# Patient Record
Sex: Female | Born: 1990
Health system: Southern US, Community
[De-identification: ages and names within clinical notes are randomized; demographics above are authoritative.]

## PROBLEM LIST (undated history)

## (undated) DIAGNOSIS — G43909 Migraine, unspecified, not intractable, without status migrainosus: Secondary | ICD-10-CM

## (undated) DIAGNOSIS — F329 Major depressive disorder, single episode, unspecified: Secondary | ICD-10-CM

## (undated) DIAGNOSIS — E559 Vitamin D deficiency, unspecified: Secondary | ICD-10-CM

## (undated) DIAGNOSIS — F32A Depression, unspecified: Secondary | ICD-10-CM

## (undated) DIAGNOSIS — F419 Anxiety disorder, unspecified: Secondary | ICD-10-CM

## (undated) DIAGNOSIS — D352 Benign neoplasm of pituitary gland: Secondary | ICD-10-CM

## (undated) DIAGNOSIS — D649 Anemia, unspecified: Secondary | ICD-10-CM

## (undated) DIAGNOSIS — E049 Nontoxic goiter, unspecified: Secondary | ICD-10-CM

## (undated) DIAGNOSIS — Z8489 Family history of other specified conditions: Secondary | ICD-10-CM

## (undated) HISTORY — DX: Migraine, unspecified, not intractable, without status migrainosus: G43.909

## (undated) HISTORY — DX: Depression, unspecified: F32.A

## (undated) HISTORY — DX: Nontoxic goiter, unspecified: E04.9

## (undated) HISTORY — DX: Major depressive disorder, single episode, unspecified: F32.9

## (undated) HISTORY — DX: Benign neoplasm of pituitary gland: D35.2

## (undated) HISTORY — DX: Anemia, unspecified: D64.9

## (undated) HISTORY — DX: Anxiety disorder, unspecified: F41.9

## (undated) HISTORY — DX: Vitamin D deficiency, unspecified: E55.9

---
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
## 2015-02-27 ENCOUNTER — Ambulatory Visit (INDEPENDENT_AMBULATORY_CARE_PROVIDER_SITE_OTHER): Payer: BLUE CROSS/BLUE SHIELD | Admitting: Family Medicine

## 2015-02-27 ENCOUNTER — Encounter: Payer: Self-pay | Admitting: Family Medicine

## 2015-02-27 VITALS — BP 126/82 | HR 100 | Temp 97.4°F | Ht 66.5 in | Wt 179.0 lb

## 2015-02-27 DIAGNOSIS — J301 Allergic rhinitis due to pollen: Secondary | ICD-10-CM | POA: Diagnosis not present

## 2015-02-27 DIAGNOSIS — F329 Major depressive disorder, single episode, unspecified: Secondary | ICD-10-CM | POA: Diagnosis not present

## 2015-02-27 DIAGNOSIS — F4010 Social phobia, unspecified: Secondary | ICD-10-CM

## 2015-02-27 DIAGNOSIS — E049 Nontoxic goiter, unspecified: Secondary | ICD-10-CM

## 2015-02-27 DIAGNOSIS — R5383 Other fatigue: Secondary | ICD-10-CM | POA: Insufficient documentation

## 2015-02-27 DIAGNOSIS — J309 Allergic rhinitis, unspecified: Secondary | ICD-10-CM | POA: Insufficient documentation

## 2015-02-27 DIAGNOSIS — F32A Depression, unspecified: Secondary | ICD-10-CM | POA: Insufficient documentation

## 2015-02-27 DIAGNOSIS — Z0000 Encounter for general adult medical examination without abnormal findings: Secondary | ICD-10-CM

## 2015-02-27 NOTE — Assessment & Plan Note (Signed)
List of therapists given with phone numbers from [facility]; patient will call and schedule an appointment; I'll be glad to see her back in 6-8 weeks to see how she's progressing; she declined offer for medicine today

## 2015-02-27 NOTE — Assessment & Plan Note (Signed)
Check thyroid US today along with TSH

## 2015-02-27 NOTE — Assessment & Plan Note (Signed)
Check TSH, vit D, Hgb today to r/o other cause, though she reports years of depression; no mania or hypomania per report; she declined offer for medicine; no SI/HI; crisis line card given for her to keep; if thoughts of self-harm develop, seek medical attention right away; she agrees; repeat PHQ-9 at follow-up in 6-8 weeks after she starts therapy

## 2015-02-27 NOTE — Assessment & Plan Note (Signed)
Avoid decongestants given her family hx of HTN; okay to use plain anti-histamines; I would be happy to add on nasal corticosteroid if desired

## 2015-02-27 NOTE — Patient Instructions (Addendum)
Try to use PLAIN allergy medicine without the decongestant Avoid: phenylephrine, phenylpropanolamine, and pseudoephredine I'm ordering a thyroid ultrasound to see if it is larger than it should be We'll also get lab tests to evaluate your thyroid and vitamin D levels, as well as routine preventive care labs If you have not heard anything from my staff in a week about any orders/referrals/studies from today, please contact us here to follow-up [phone] Please do schedule an appointment to start working with a therapist Return in 6-8 weeks to see how therapy is coming along If you ever have thoughts of hurting yourself, seek medical care immediately Keep the crisis line card with you in case of any need in the future Women should have physicals yearly and pap smears starting at age 2, with follow-up pap smears every three years If we can schedule you for a physical, we'll be glad to take care of that for you here; if you would prefer to see a gynecologist, you are welcome to go there for your physicals... your choice

## 2015-02-27 NOTE — Progress Notes (Signed)
BP 126/82 mmHg  Pulse 100  Temp(Src) 97.4 F (36.3 C)  Ht 5' 6.5" (1.689 m)  Wt 179 lb (81.194 kg)  BMI 28.46 kg/m2  SpO2 100%  LMP 02/13/2015 (Approximate)   Subjective:    Patient ID: Sabrina Mcgee, female    DOB: 1991-06-30, 24 y.o.   MRN: [id number]  HPI: Sabrina Mcgee is a 24 y.o. female  Chief Complaint  Patient presents with  . Establish Care   Allergies Social anxiety, depression; no thoughts of self-harm in a few years; interested in counseling; she does not want medicine; social anxiety started in elementary school; she denies any hx of mania or hypomania; anxiety runs in the family; she is an introvert  Allergies; uses plain claritin if needed  Fam hx of thyroid issue; patient does feel lump in neck she thinks  Mother:  She has always had high blood pressure; no heart attack; not quite sure; not sure about cholesterol, but she doesn't have diabetes Father: just getting over kidney cancer Cancer on dad's side of the family  No recent labs; had some juice for breakfast  History reviewed. No pertinent past medical history.  Family History  Problem Relation Age of Onset  . Heart disease Mother   . Hypertension Mother   . Cancer Father     kidney  . Heart disease Father   . Hypertension Father   . Cancer Paternal Aunt     breast  . Heart disease Maternal Grandmother   . Hypertension Maternal Grandmother   . Heart disease Maternal Grandfather   . Hypertension Maternal Grandfather   . Heart disease Paternal Grandmother   . Hypertension Paternal Grandmother   . COPD Paternal Grandmother   . Cancer Paternal Grandfather     prostate  . Heart disease Paternal Grandfather   . Hypertension Paternal Grandfather   . Diabetes Paternal Aunt    Relevant past medical, surgical, family and social history reviewed and updated as indicated. Interim medical history since our last visit reviewed. Allergies and medications reviewed and updated.  Review of Systems   Constitutional: Negative for unexpected weight change.  Gastrointestinal: Positive for constipation (fluctuates).  Endocrine: Positive for cold intolerance. Negative for polydipsia and polyuria.  Allergic/Immunologic: Positive for environmental allergies (seasonal allergies).  Psychiatric/Behavioral: The patient is nervous/anxious (social anxiety and depression since childhood; she will be open to seeing someone).    Per HPI unless specifically indicated above     Objective:    BP 126/82 mmHg  Pulse 100  Temp(Src) 97.4 F (36.3 C)  Ht 5' 6.5" (1.689 m)  Wt 179 lb (81.194 kg)  BMI 28.46 kg/m2  SpO2 100%  LMP 02/13/2015 (Approximate)  Wt Readings from Last 3 Encounters:  02/27/15 179 lb (81.194 kg)    Physical Exam  Constitutional: She appears well-developed and well-nourished. No distress.  HENT:  Head: Normocephalic and atraumatic.  Eyes: EOM are normal. No scleral icterus.  Neck: Thyromegaly (right lobe larger than left) present.  Thyroid nontender  Cardiovascular: Normal rate, regular rhythm and normal heart sounds.   No murmur heard. Pulmonary/Chest: Effort normal and breath sounds normal. No respiratory distress. She has no wheezes.  Abdominal: Soft. Bowel sounds are normal. She exhibits no distension.  Musculoskeletal: Normal range of motion. She exhibits no edema.  Neurological: She is alert. She exhibits normal muscle tone.  Reflex Scores:      Patellar reflexes are 2+ on the right side and 2+ on the left side. Skin: Skin  is warm and dry. No rash noted. She is not diaphoretic. No pallor.  Psychiatric: Her speech is normal and behavior is normal. Judgment and thought content normal. Her mood appears anxious. Cognition and memory are normal.  Quiet, soft-spoken; good eye contact with examiner    No results found for this or any previous visit.    Assessment & Plan:   Problem List Items Addressed This Visit      Respiratory   Allergic rhinitis    Avoid  decongestants given her family hx of HTN; okay to use plain anti-histamines; I would be happy to add on nasal corticosteroid if desired        Endocrine   Goiter    Check thyroid US today along with TSH      Relevant Orders   TSH   US Soft Tissue Head/Neck     Other   Fatigue    Check TSH, vit D, Hgb      Relevant Orders   TSH   Vit D  25 hydroxy (rtn osteoporosis monitoring)   Social anxiety disorder - Primary    List of therapists given with phone numbers from [facility]; patient will call and schedule an appointment; I'll be glad to see her back in 6-8 weeks to see how she's progressing; she declined offer for medicine today      Depression    Check TSH, vit D, Hgb today to r/o other cause, though she reports years of depression; no mania or hypomania per report; she declined offer for medicine; no SI/HI; crisis line card given for her to keep; if thoughts of self-harm develop, seek medical attention right away; she agrees; repeat PHQ-9 at follow-up in 6-8 weeks after she starts therapy       Other Visit Diagnoses    Preventative health care        Relevant Orders    CBC with Differential/Platelet    Lipid Panel w/o Chol/HDL Ratio    Comprehensive metabolic panel       Follow up plan: Return in about 7 weeks (around 04/17/2015) for for follow-up.  An after-visit summary was printed and given to the patient at [place].  Please see the patient instructions which may contain other information and recommendations beyond what is mentioned above in the assessment and plan.

## 2015-02-27 NOTE — Assessment & Plan Note (Signed)
Check TSH, vit D, Hgb

## 2015-02-28 ENCOUNTER — Telehealth: Payer: Self-pay | Admitting: Family Medicine

## 2015-02-28 DIAGNOSIS — D509 Iron deficiency anemia, unspecified: Secondary | ICD-10-CM

## 2015-02-28 DIAGNOSIS — E559 Vitamin D deficiency, unspecified: Secondary | ICD-10-CM

## 2015-02-28 LAB — CBC WITH DIFFERENTIAL/PLATELET
Basophils Absolute: 0 10*3/uL (ref 0.0–0.2)
Basos: 0 %
EOS (ABSOLUTE): 0 10*3/uL (ref 0.0–0.4)
Eos: 1 %
Hematocrit: 31 % — ABNORMAL LOW (ref 34.0–46.6)
Hemoglobin: 10.3 g/dL — ABNORMAL LOW (ref 11.1–15.9)
Immature Grans (Abs): 0 10*3/uL (ref 0.0–0.1)
Immature Granulocytes: 0 %
Lymphocytes Absolute: 1.8 10*3/uL (ref 0.7–3.1)
Lymphs: 36 %
MCH: 25.3 pg — ABNORMAL LOW (ref 26.6–33.0)
MCHC: 33.2 g/dL (ref 31.5–35.7)
MCV: 76 fL — ABNORMAL LOW (ref 79–97)
Monocytes Absolute: 0.5 10*3/uL (ref 0.1–0.9)
Monocytes: 11 %
Neutrophils Absolute: 2.6 10*3/uL (ref 1.4–7.0)
Neutrophils: 52 %
Platelets: 152 10*3/uL (ref 150–379)
RBC: 4.07 x10E6/uL (ref 3.77–5.28)
RDW: 16.8 % — ABNORMAL HIGH (ref 12.3–15.4)
WBC: 5 10*3/uL (ref 3.4–10.8)

## 2015-02-28 LAB — COMPREHENSIVE METABOLIC PANEL
ALT: 9 IU/L (ref 0–32)
AST: 15 IU/L (ref 0–40)
Albumin/Globulin Ratio: 1.5 (ref 1.1–2.5)
Albumin: 4.1 g/dL (ref 3.5–5.5)
Alkaline Phosphatase: 100 IU/L (ref 39–117)
BUN/Creatinine Ratio: 15 (ref 8–20)
BUN: 10 mg/dL (ref 6–20)
Bilirubin Total: 0.3 mg/dL (ref 0.0–1.2)
CO2: 20 mmol/L (ref 18–29)
Calcium: 9.4 mg/dL (ref 8.7–10.2)
Chloride: 104 mmol/L (ref 97–108)
Creatinine, Ser: 0.66 mg/dL (ref 0.57–1.00)
GFR calc Af Amer: 145 mL/min/{1.73_m2} (ref >59)
GFR calc non Af Amer: 126 mL/min/{1.73_m2} (ref >59)
Globulin, Total: 2.7 g/dL (ref 1.5–4.5)
Glucose: 89 mg/dL (ref 65–99)
Potassium: 3.9 mmol/L (ref 3.5–5.2)
Sodium: 141 mmol/L (ref 134–144)
Total Protein: 6.8 g/dL (ref 6.0–8.5)

## 2015-02-28 LAB — LIPID PANEL W/O CHOL/HDL RATIO
Cholesterol, Total: 154 mg/dL (ref 100–199)
HDL: 55 mg/dL (ref >39)
LDL Calculated: 86 mg/dL (ref 0–99)
Triglycerides: 67 mg/dL (ref 0–149)
VLDL Cholesterol Cal: 13 mg/dL (ref 5–40)

## 2015-02-28 LAB — TSH: TSH: 2.37 u[IU]/mL (ref 0.450–4.500)

## 2015-02-28 LAB — VITAMIN D 25 HYDROXY (VIT D DEFICIENCY, FRACTURES): Vit D, 25-Hydroxy: 11.2 ng/mL — ABNORMAL LOW (ref 30.0–100.0)

## 2015-02-28 MED ORDER — FERROUS SULFATE 325 (65 FE) MG PO TABS: 325.0000 mg | ORAL_TABLET | Freq: Every day | ORAL | Status: DC

## 2015-02-28 MED ORDER — VITAMIN D (ERGOCALCIFEROL) 1.25 MG (50000 UNIT) PO CAPS: 50000.0000 [IU] | ORAL_CAPSULE | ORAL | Status: DC

## 2015-02-28 NOTE — Telephone Encounter (Signed)
Lab notified to add on Ferritin.  No answer when attempting to reach patient.

## 2015-02-28 NOTE — Telephone Encounter (Signed)
Please add on a FERRITIN to blood in lab (I have entered it as a "future" order so it should be there for you, but the ferritin is the ONLY one to add on) Let patient know that her blood tests show that she is anemic and would benefit from some iron Have her take ferrous sulfate 325 mg daily in the afternoon with vit C tablet or glass of orange juice to help absorption; we'll recheck labs in 3 months Her vitamin D is really low; please start prescription vitamin D once a week (Rx already sent);  She'll take the prescription just once a week for 2 months, then start 1,000 i.u. OTC vitamin D3 daily after she finishes the last bottle of Rx vit D We'll recheck that lab too in 3 months

## 2015-03-01 LAB — SPECIMEN STATUS REPORT

## 2015-03-01 LAB — FERRITIN: Ferritin: 6 ng/mL — ABNORMAL LOW (ref 15–150)

## 2015-03-01 NOTE — Telephone Encounter (Signed)
Patient notified

## 2015-03-01 NOTE — Assessment & Plan Note (Signed)
Low ferritin, start iron, check in 3 months

## 2015-03-01 NOTE — Telephone Encounter (Signed)
Dr. Sanda Klein will you review her Ferritin and let me know what to tell her before I try to contact her again? Thanks!

## 2015-03-01 NOTE — Telephone Encounter (Signed)
Great idea, thank you Sabrina Mcgee I reviewed ferritin and it supports chronic blood loss We'll continue the plan as below and I'll add a ferritin to the labs to get in 3 months Thank you!

## 2015-04-17 ENCOUNTER — Encounter: Payer: Self-pay | Admitting: Family Medicine

## 2015-04-17 ENCOUNTER — Ambulatory Visit (INDEPENDENT_AMBULATORY_CARE_PROVIDER_SITE_OTHER): Payer: BLUE CROSS/BLUE SHIELD | Admitting: Family Medicine

## 2015-04-17 VITALS — BP 118/78 | HR 99 | Temp 98.4°F | Wt 180.0 lb

## 2015-04-17 DIAGNOSIS — B354 Tinea corporis: Secondary | ICD-10-CM

## 2015-04-17 DIAGNOSIS — E559 Vitamin D deficiency, unspecified: Secondary | ICD-10-CM

## 2015-04-17 DIAGNOSIS — R5383 Other fatigue: Secondary | ICD-10-CM | POA: Diagnosis not present

## 2015-04-17 DIAGNOSIS — D509 Iron deficiency anemia, unspecified: Secondary | ICD-10-CM

## 2015-04-17 DIAGNOSIS — F4010 Social phobia, unspecified: Secondary | ICD-10-CM

## 2015-04-17 DIAGNOSIS — E049 Nontoxic goiter, unspecified: Secondary | ICD-10-CM | POA: Diagnosis not present

## 2015-04-17 MED ORDER — ESCITALOPRAM OXALATE 10 MG PO TABS: 10.0000 mg | ORAL_TABLET | Freq: Every day | ORAL | Status: DC

## 2015-04-17 NOTE — Assessment & Plan Note (Signed)
She has taken the Rx and we'll see what the level is

## 2015-04-17 NOTE — Assessment & Plan Note (Signed)
Schedule the [place] ordered in August; last TSH was normal

## 2015-04-17 NOTE — Assessment & Plan Note (Signed)
Recheck today; continue iron supplementation

## 2015-04-17 NOTE — Patient Instructions (Addendum)
Try over-the-counter tolnaftate on the rash on the chest, treat up to one month until completely clear Start the new medicine for anxiety We'll get the ultrasound We'll contact you about the labs If you have not heard anything from my staff in a week about any orders/referrals/studies from today, please contact us here to follow-up [phone] Return in 4 weeks for follow-up on the new medicine

## 2015-04-17 NOTE — Assessment & Plan Note (Signed)
Check ferritin, CBC, vit D

## 2015-04-17 NOTE — Assessment & Plan Note (Addendum)
Start SSRI, send Rx to [facility]; return in 4 weeks for reassessment; call sooner if any problems; discussed risk of mania, hypomania, reasons to call

## 2015-04-17 NOTE — Progress Notes (Signed)
BP 118/78 mmHg  Pulse 99  Temp(Src) 98.4 F (36.9 C)  Wt 180 lb (81.647 kg)  SpO2 100%  LMP 04/14/2015 (Exact Date)   Subjective:    Patient ID: Sabrina Mcgee, female    DOB: 1991/07/05, 24 y.o.   MRN: [id number]  HPI: Sabrina Mcgee is a 24 y.o. female  Chief Complaint  Patient presents with  . Depression  . Anxiety   Vitamin D deficiency; still taking vitamin supplementation  Anemia with low ferritin; taking iron suppolemention without constipation; likes dark leafy veggies; no lentils  Mood is "okay"; anxiety, worries about a lot of things; no SI/HI; not sure if family members have anxiety;   Periods are not regular  Both grandmothers have thyroid disease; not sure of etiology; neither parent has thyroid disease  She does not want flu vaccine, counseling given  Relevant past medical, surgical, family and social history reviewed and updated as indicated. Interim medical history since our last visit reviewed. Allergies and medications reviewed and updated.  Review of Systems  Per HPI unless specifically indicated above     Objective:    BP 118/78 mmHg  Pulse 99  Temp(Src) 98.4 F (36.9 C)  Wt 180 lb (81.647 kg)  SpO2 100%  LMP 04/14/2015 (Exact Date)  Wt Readings from Last 3 Encounters:  04/17/15 180 lb (81.647 kg)  02/27/15 179 lb (81.194 kg)    Physical Exam  Constitutional: She appears well-developed and well-nourished.  Weight stable  Skin: Lesion (nummular lesion over upper right side chest dime-sized; shiny central area with clear defined margin with scale; mild erythema) noted.  Psychiatric: Her behavior is normal. Thought content normal. Her mood appears anxious. Her affect is not blunt. Her speech is not rapid and/or pressured. She is not agitated, not aggressive, not hyperactive, not slowed and not withdrawn. Cognition and memory are normal.  Slightly anxious; good eye contact; a little withdrawn, evasive, but polite    Results for orders  placed or performed in visit on 02/27/15  CBC with Differential/Platelet  Result Value Ref Range   WBC 5.0 3.4 - 10.8 x10E3/uL   RBC 4.07 3.77 - 5.28 x10E6/uL   Hemoglobin 10.3 (L) 11.1 - 15.9 g/dL   Hematocrit 31.0 (L) 34.0 - 46.6 %   MCV 76 (L) 79 - 97 fL   MCH 25.3 (L) 26.6 - 33.0 pg   MCHC 33.2 31.5 - 35.7 g/dL   RDW 16.8 (H) 12.3 - 15.4 %   Platelets 152 150 - 379 x10E3/uL   Neutrophils 52 %   Lymphs 36 %   Monocytes 11 %   Eos 1 %   Basos 0 %   Neutrophils Absolute 2.6 1.4 - 7.0 x10E3/uL   Lymphocytes Absolute 1.8 0.7 - 3.1 x10E3/uL   Monocytes Absolute 0.5 0.1 - 0.9 x10E3/uL   EOS (ABSOLUTE) 0.0 0.0 - 0.4 x10E3/uL   Basophils Absolute 0.0 0.0 - 0.2 x10E3/uL   Immature Granulocytes 0 %   Immature Grans (Abs) 0.0 0.0 - 0.1 x10E3/uL  Lipid Panel w/o Chol/HDL Ratio  Result Value Ref Range   Cholesterol, Total 154 100 - 199 mg/dL   Triglycerides 67 0 - 149 mg/dL   HDL 55 >39 mg/dL   VLDL Cholesterol Cal 13 5 - 40 mg/dL   LDL Calculated 86 0 - 99 mg/dL  TSH  Result Value Ref Range   TSH 2.370 0.450 - 4.500 uIU/mL  Vit D  25 hydroxy (rtn osteoporosis monitoring)  Result Value Ref  Range   Vit D, 25-Hydroxy 11.2 (L) 30.0 - 100.0 ng/mL  Comprehensive metabolic panel  Result Value Ref Range   Glucose 89 65 - 99 mg/dL   BUN 10 6 - 20 mg/dL   Creatinine, Ser 0.66 0.57 - 1.00 mg/dL   GFR calc non Af Amer 126 >59 mL/min/1.73   GFR calc Af Amer 145 >59 mL/min/1.73   BUN/Creatinine Ratio 15 8 - 20   Sodium 141 134 - 144 mmol/L   Potassium 3.9 3.5 - 5.2 mmol/L   Chloride 104 97 - 108 mmol/L   CO2 20 18 - 29 mmol/L   Calcium 9.4 8.7 - 10.2 mg/dL   Total Protein 6.8 6.0 - 8.5 g/dL   Albumin 4.1 3.5 - 5.5 g/dL   Globulin, Total 2.7 1.5 - 4.5 g/dL   Albumin/Globulin Ratio 1.5 1.1 - 2.5   Bilirubin Total 0.3 0.0 - 1.2 mg/dL   Alkaline Phosphatase 100 39 - 117 IU/L   AST 15 0 - 40 IU/L   ALT 9 0 - 32 IU/L  Specimen status report  Result Value Ref Range   specimen status  report Comment   Ferritin  Result Value Ref Range   Ferritin 6 (L) 15 - 150 ng/mL      Assessment & Plan:   Problem List Items Addressed This Visit      Endocrine   Goiter    Schedule the [place] ordered in August; last TSH was normal        Other   Fatigue    Check ferritin, CBC, vit D      Social anxiety disorder - Primary    Start SSRI, send Rx to [facility]; return in 4 weeks for reassessment; call sooner if any problems; discussed risk of mania, hypomania, reasons to call      Microcytic hypochromic anemia    Recheck today; continue iron supplementation      Vitamin D deficiency    She has taken the Rx and we'll see what the level is       Other Visit Diagnoses    Tinea corporis           Follow up plan: Return in about 4 weeks (around 05/15/2015) for medicine follow-up.  An after-visit summary was printed and given to the patient at [place].  Please see the patient instructions which [facility] Graca contain other information and recommendations beyond what is mentioned above in the assessment and plan. Meds ordered this encounter  Medications  . escitalopram (LEXAPRO) 10 MG tablet    Sig: Take 1 tablet (10 mg total) by mouth daily.    Dispense:  30 tablet    Refill:  1

## 2015-04-18 LAB — CBC
Hematocrit: 35.8 % (ref 34.0–46.6)
Hemoglobin: 11.7 g/dL (ref 11.1–15.9)
MCH: 27.3 pg (ref 26.6–33.0)
MCHC: 32.7 g/dL (ref 31.5–35.7)
MCV: 83 fL (ref 79–97)
Platelets: 155 10*3/uL (ref 150–379)
RBC: 4.29 x10E6/uL (ref 3.77–5.28)
RDW: 17.2 % — ABNORMAL HIGH (ref 12.3–15.4)
WBC: 3.4 10*3/uL (ref 3.4–10.8)

## 2015-04-18 LAB — FERRITIN: Ferritin: 12 ng/mL — ABNORMAL LOW (ref 15–150)

## 2015-04-18 LAB — VITAMIN D 25 HYDROXY (VIT D DEFICIENCY, FRACTURES): Vit D, 25-Hydroxy: 22.9 ng/mL — ABNORMAL LOW (ref 30.0–100.0)

## 2015-04-21 ENCOUNTER — Ambulatory Visit
Admission: RE | Admit: 2015-04-21 | Discharge: 2015-04-21 | Disposition: A | Discharge status: Home / Self Care | Payer: BLUE CROSS/BLUE SHIELD | Source: Ambulatory Visit | Attending: Family Medicine | Admitting: Family Medicine

## 2015-04-21 DIAGNOSIS — R591 Generalized enlarged lymph nodes: Secondary | ICD-10-CM | POA: Diagnosis not present

## 2015-04-21 DIAGNOSIS — E049 Nontoxic goiter, unspecified: Secondary | ICD-10-CM | POA: Diagnosis present

## 2015-04-21 NOTE — Progress Notes (Signed)
Quick Note:  [facility]. The thyroid gland is completely normal in appearance... No enlargement, no nodules, nothing worrisome ______

## 2015-04-24 ENCOUNTER — Telehealth: Payer: Self-pay | Admitting: Family Medicine

## 2015-04-24 NOTE — Telephone Encounter (Signed)
I called, left brief msg When I reach her, will discuss thyroid US (completely normal) and labs (improved ferritin but still low, improved vit D but still low, anemia has resolved)

## 2015-04-25 NOTE — Telephone Encounter (Signed)
Vitamin D3 2,000 iu daily for one bottle Keep taking iron for 1-2 more months, stores are coming up Thyroid looked normal Okay to take aleve periodically if needed (her question)

## 2015-05-15 ENCOUNTER — Encounter: Payer: Self-pay | Admitting: Family Medicine

## 2015-05-15 ENCOUNTER — Ambulatory Visit (INDEPENDENT_AMBULATORY_CARE_PROVIDER_SITE_OTHER): Payer: BLUE CROSS/BLUE SHIELD | Admitting: Family Medicine

## 2015-05-15 VITALS — BP 107/76 | HR 87 | Temp 98.6°F | Wt 180.0 lb

## 2015-05-15 DIAGNOSIS — F329 Major depressive disorder, single episode, unspecified: Secondary | ICD-10-CM | POA: Diagnosis not present

## 2015-05-15 DIAGNOSIS — E559 Vitamin D deficiency, unspecified: Secondary | ICD-10-CM

## 2015-05-15 DIAGNOSIS — F4010 Social phobia, unspecified: Secondary | ICD-10-CM

## 2015-05-15 DIAGNOSIS — F32A Depression, unspecified: Secondary | ICD-10-CM

## 2015-05-15 DIAGNOSIS — Z23 Encounter for immunization: Secondary | ICD-10-CM

## 2015-05-15 MED ORDER — ESCITALOPRAM OXALATE 10 MG PO TABS: 10.0000 mg | ORAL_TABLET | Freq: Every day | ORAL | Status: DC

## 2015-05-15 NOTE — Assessment & Plan Note (Signed)
She has finished Rx vitamin D, to take OTC vit D

## 2015-05-15 NOTE — Assessment & Plan Note (Signed)
Continue same medicine; refills provided; f/u 6 months

## 2015-05-15 NOTE — Assessment & Plan Note (Signed)
Doing well on escitalopram; PHQ-2 score was 0 today; continue same dose; refills provided

## 2015-05-15 NOTE — Patient Instructions (Addendum)
We'll keep you on the same dose of the escitalopram Consider doing yoga or other mindfulness practice for relaxation and stress alleviation Return in 6 months for next follow-up, but I'm here sooner if needed You received the flu shot today; it should protect you against the flu virus over the coming months; it will take about two weeks for antibodies to develop; do try to stay away from hospitals, nursing homes, and daycares during peak flu season; taking 1000 mg of vitamin C daily during flu season may help you avoid getting sick If the rectal pain recurs, come back in immediately for evaluation and examination; try to get 25 or more grams of fiber a day and drink 64 ounces of water or other caffeine-free beverages daily

## 2015-05-15 NOTE — Progress Notes (Signed)
BP 107/76 mmHg  Pulse 87  Temp(Src) 98.6 F (37 C)  Wt 180 lb (81.647 kg)  SpO2 100%  LMP 04/14/2015 (Exact Date)   Subjective:    Patient ID: Sabrina Mcgee, female    DOB: 1990-12-04, 24 y.o.   MRN: [id number]  HPI: Sabrina Mcgee is a 24 y.o. female  Chief Complaint  Patient presents with  . Anxiety    4 week follow up, doing well on the Escitalopram.   Anxiety is doing better No hypomanic or manic thoughts mild headaches, mild nausea, no trouble sleeping; not enough to change med or dose; content with dose  GAD 7 : Generalized Anxiety Score 05/15/2015  Nervous, Anxious, on Edge 3  Control/stop worrying 0  Worry too much - different things 1  Trouble relaxing 3  Restless 0  Easily annoyed or irritable 0  Afraid - awful might happen 0  Total GAD 7 Score 7  Anxiety Difficulty Not difficult at all   Depression screen [facility] 2/9 05/15/2015 04/17/2015 02/27/2015  Decreased Interest 0 2 2  Down, Depressed, Hopeless 0 2 1  PHQ - 2 Score 0 4 3  Altered sleeping - 3 3  Tired, decreased energy - 3 3  Change in appetite - 1 1  Feeling bad or failure about yourself  - 2 3  Trouble concentrating - 2 0  Moving slowly or fidgety/restless - 0 0  Suicidal thoughts - 0 0  PHQ-9 Score - 15 13  Difficult doing work/chores - - Somewhat difficult   She is not doing any yoga or tai chi or counseling No unusual dreams Would like to stay where she is  Two Fridays ago, she had pretty bad rectal pain; no blood in the stool; does not think it was constipation, or hemorrhoids, or fissure; no hard stools before that; like a spasm or cramp; that pain has completely gone; no blood in the stool; no mucous in the stool; no spots on teh underwear; no acid or spicy foods; she says that sometimes happens before her menstrual cycle; never like that and didn't get her period after the pain; she denies burning with urination; BMs are regular; not sexually active  Low Vit D; finished up the Rx Low  ferritin; has come up from 6 to 12 on last labs  Relevant past medical, surgical, family and social history reviewed and updated as indicated. Interim medical history since our last visit reviewed. Allergies and medications reviewed and updated.  Review of Systems Per HPI unless specifically indicated above     Objective:    BP 107/76 mmHg  Pulse 87  Temp(Src) 98.6 F (37 C)  Wt 180 lb (81.647 kg)  SpO2 100%  LMP 04/14/2015 (Exact Date)  Wt Readings from Last 3 Encounters:  05/15/15 180 lb (81.647 kg)  04/17/15 180 lb (81.647 kg)  02/27/15 179 lb (81.194 kg)    Physical Exam  Constitutional: She appears well-developed and well-nourished.  Weight stable  Eyes: EOM are normal.  Cardiovascular: Normal rate and regular rhythm.   Pulmonary/Chest: Effort normal and breath sounds normal.  Psychiatric: She has a normal mood and affect. Her speech is normal and behavior is normal. Judgment and thought content normal. Cognition and memory are normal.    Results for orders placed or performed in visit on 04/17/15  Ferritin  Result Value Ref Range   Ferritin 12 (L) 15 - 150 ng/mL  Vit D  25 hydroxy (rtn osteoporosis monitoring)  Result Value  Ref Range   Vit D, 25-Hydroxy 22.9 (L) 30.0 - 100.0 ng/mL  CBC  Result Value Ref Range   WBC 3.4 3.4 - 10.8 x10E3/uL   RBC 4.29 3.77 - 5.28 x10E6/uL   Hemoglobin 11.7 11.1 - 15.9 g/dL   Hematocrit 35.8 34.0 - 46.6 %   MCV 83 79 - 97 fL   MCH 27.3 26.6 - 33.0 pg   MCHC 32.7 31.5 - 35.7 g/dL   RDW 17.2 (H) 12.3 - 15.4 %   Platelets 155 150 - 379 x10E3/uL      Assessment & Plan:   Problem List Items Addressed This Visit      Other   Social anxiety disorder    Continue same medicine; refills provided; f/u 6 months      Depression    Doing well on escitalopram; PHQ-2 score was 0 today; continue same dose; refills provided      Relevant Medications   escitalopram (LEXAPRO) 10 MG tablet   Vitamin D deficiency    She has finished  Rx vitamin D, to take OTC vit D       Other Visit Diagnoses    Needs flu shot    -  Primary    two weeks to build up antibodies    Encounter for immunization           Follow up plan: Return in about 6 months (around 11/13/2015) for mood.  An after-visit summary was printed and given to the patient at [place].  Please see the patient instructions which may contain other information and recommendations beyond what is mentioned above in the assessment and plan. Meds ordered this encounter  Medications  . escitalopram (LEXAPRO) 10 MG tablet    Sig: Take 1 tablet (10 mg total) by mouth daily.    Dispense:  30 tablet    Refill:  6

## 2015-08-07 ENCOUNTER — Encounter: Payer: Self-pay | Admitting: Family Medicine

## 2015-08-07 ENCOUNTER — Ambulatory Visit (INDEPENDENT_AMBULATORY_CARE_PROVIDER_SITE_OTHER): Payer: BLUE CROSS/BLUE SHIELD | Admitting: Family Medicine

## 2015-08-07 VITALS — BP 117/79 | HR 97 | Temp 98.8°F | Ht 66.25 in | Wt 180.0 lb

## 2015-08-07 DIAGNOSIS — R7989 Other specified abnormal findings of blood chemistry: Secondary | ICD-10-CM | POA: Diagnosis not present

## 2015-08-07 DIAGNOSIS — R1011 Right upper quadrant pain: Secondary | ICD-10-CM | POA: Insufficient documentation

## 2015-08-07 NOTE — Progress Notes (Signed)
BP 117/79 mmHg  Pulse 97  Temp(Src) 98.8 F (37.1 C)  Ht 5' 6.25" (1.683 m)  Wt 180 lb (81.647 kg)  BMI 28.83 kg/m2  SpO2 100%  LMP 07/17/2015 (Approximate)   Subjective:    Patient ID: Sabrina Mcgee, female    DOB: [birth year], 25 y.o.   MRN: [id number]  HPI: Sabrina Mcgee is a 25 y.o. female  Chief Complaint  Patient presents with  . Abdominal Pain    right sided abdominal pain since last Wednesday. No N/V/D. No fever. Feels like the pain is radiating down and around to her back. No history of kidney stones. No urinary symptoms.   Right-sided abdominal pain Started last Wed; felt fine before No changes; outside in the snow but no unusual activity Hit all of sudden Wed mornings Pain is still there, not as bad Pain was pretty bad at one point Pretty consistent Eating does change the pain now; but did hurt to eat at first No ulcer in the past Stools normal; no blood, not maroon; stools are not tan No fevers No travel No urine symptoms; urinating fine No personal hx of kidney stones Father has hx of kidney cancer; no blood in the urine  She thinks she has lost a little weight over the last week No night sweats No cough Did have sinus infection; took sudafed and it's cleared up  Relevant past medical, surgical, family and social history reviewed and updated as indicated. Interim medical history since our last visit reviewed. Allergies and medications reviewed and updated.  Review of Systems  Per HPI unless specifically indicated above     Objective:    BP 117/79 mmHg  Pulse 97  Temp(Src) 98.8 F (37.1 C)  Ht 5' 6.25" (1.683 m)  Wt 180 lb (81.647 kg)  BMI 28.83 kg/m2  SpO2 100%  LMP 07/17/2015 (Approximate)  Wt Readings from Last 3 Encounters:  08/07/15 180 lb (81.647 kg)  05/15/15 180 lb (81.647 kg)  04/17/15 180 lb (81.647 kg)    Physical Exam  Constitutional: She appears well-developed and well-nourished.  Cardiovascular: Normal rate and regular  rhythm.   Pulmonary/Chest: Effort normal and breath sounds normal.  Abdominal: She exhibits no distension. There is tenderness (mild RUQ). There is no guarding.  Musculoskeletal: She exhibits no edema.  Skin: Skin is warm. No erythema. No pallor.  Psychiatric: She has a normal mood and affect.    Results for orders placed or performed in visit on 08/07/15  CBC With Differential/Platelet  Result Value Ref Range   WBC 4.6 3.4 - 10.8 x10E3/uL   RBC 4.49 3.77 - 5.28 x10E6/uL   Hemoglobin 13.3 11.1 - 15.9 g/dL   Hematocrit 36.1 34.0 - 46.6 %   MCV 80 79 - 97 fL   MCH 29.6 26.6 - 33.0 pg   MCHC 36.8 (H) 31.5 - 35.7 g/dL   RDW 14.2 12.3 - 15.4 %   Platelets 174 150 - 379 x10E3/uL   Neutrophils 54 %   Lymphs 40 %   MID 7 %   Neutrophils Absolute 2.5 1.4 - 7.0 x10E3/uL   Lymphocytes Absolute 1.8 0.7 - 3.1 x10E3/uL   MID (Absolute) 0.3 0.1 - 1.6 X10E3/uL  UA/M w/rflx Culture, Routine  Result Value Ref Range   Specific Gravity, UA >1.030 (H) 1.005 - 1.030   pH, UA 5.5 5.0 - 7.5   Color, UA Yellow Yellow   Appearance Ur Cloudy (A) Clear   Leukocytes, UA Negative Negative   Protein,  UA Negative Negative/Trace   Glucose, UA Negative Negative   Ketones, UA 2+ (A) Negative   RBC, UA Negative Negative   Bilirubin, UA Negative Negative   Urobilinogen, Ur 0.2 0.2 - 1.0 mg/dL   Nitrite, UA Negative Negative  ALT (SGPT) Piccolo, Waived  Result Value Ref Range   ALT (SGPT) Piccolo, Waived 30 10 - 47 U/L  AST (SGOT) Piccolo, Waived  Result Value Ref Range   AST (SGOT) Piccolo, Waived 27 11 - 38 U/L  Comprehensive metabolic panel  Result Value Ref Range   Glucose 86 65 - 99 mg/dL   BUN 10 6 - 20 mg/dL   Creatinine, Ser 0.72 0.57 - 1.00 mg/dL   GFR calc non Af Amer 119 >59 mL/min/1.73   GFR calc Af Amer 137 >59 mL/min/1.73   BUN/Creatinine Ratio 14 8 - 20   Sodium 140 134 - 144 mmol/L   Potassium 4.2 3.5 - 5.2 mmol/L   Chloride 103 96 - 106 mmol/L   CO2 20 18 - 29 mmol/L   Calcium  9.4 8.7 - 10.2 mg/dL   Total Protein 7.6 6.0 - 8.5 g/dL   Albumin 4.6 3.5 - 5.5 g/dL   Globulin, Total 3.0 1.5 - 4.5 g/dL   Albumin/Globulin Ratio 1.5 1.1 - 2.5   Bilirubin Total 0.5 0.0 - 1.2 mg/dL   Alkaline Phosphatase 98 39 - 117 IU/L   AST 20 0 - 40 IU/L   ALT 30 0 - 32 IU/L  Pathologist smear review  Result Value Ref Range   Path Rev WBC  Normal    Path Rev RBC  Normal    Path Rev PLTs  Normal    PATHOLOGIST NAME Comment       Assessment & Plan:   Problem List Items Addressed This Visit      Other   Abdominal pain, right upper quadrant - Primary    Will get labs today, in-house SGOT and SGPT were normal; will get [place] of gallbladder and liver; f/u with HIDA scan or other testing; reasons to go to ER reviewed      Relevant Orders   CBC With Differential/Platelet (Completed)   UA/M w/rflx Culture, Routine (Completed)   ALT (SGPT) Piccolo, Waived (Completed)   AST (SGOT) Piccolo, Waived (Completed)   Comprehensive metabolic panel (Completed)   US Abdomen Limited RUQ (Completed)   Abnormal morphology of platelets    Noted on CBC in-house today; sending for path smear review      Relevant Orders   Pathologist smear review (Completed)       Follow up plan: No Follow-up on file.   Orders Placed This Encounter  Procedures  . US Abdomen Limited RUQ  . CBC With Differential/Platelet  . UA/M w/rflx Culture, Routine  . ALT (SGPT) Piccolo, [place]  . AST (SGOT) Piccolo, [place]  . Comprehensive metabolic panel  . Pathologist smear review   An after-visit summary was printed and given to the patient at [facility].  Please see the patient instructions which may contain other information and recommendations beyond what is mentioned above in the assessment and plan.

## 2015-08-07 NOTE — Patient Instructions (Signed)
Please do seek immediate medical attention if pain worsens Let's get an ultrasound of your liver

## 2015-08-08 LAB — CBC WITH DIFFERENTIAL/PLATELET
Hematocrit: 36.1 % (ref 34.0–46.6)
Hemoglobin: 13.3 g/dL (ref 11.1–15.9)
Lymphocytes Absolute: 1.8 10*3/uL (ref 0.7–3.1)
Lymphs: 40 %
MCH: 29.6 pg (ref 26.6–33.0)
MCHC: 36.8 g/dL — ABNORMAL HIGH (ref 31.5–35.7)
MCV: 80 fL (ref 79–97)
MID (Absolute): 0.3 10*3/uL (ref 0.1–1.6)
MID: 7 %
Neutrophils Absolute: 2.5 10*3/uL (ref 1.4–7.0)
Neutrophils: 54 %
Platelets: 174 10*3/uL (ref 150–379)
RBC: 4.49 x10E6/uL (ref 3.77–5.28)
RDW: 14.2 % (ref 12.3–15.4)
WBC: 4.6 10*3/uL (ref 3.4–10.8)

## 2015-08-08 LAB — UA/M W/RFLX CULTURE, ROUTINE
Bilirubin, UA: NEGATIVE
Glucose, UA: NEGATIVE
Leukocytes, UA: NEGATIVE
Nitrite, UA: NEGATIVE
Protein, UA: NEGATIVE
RBC, UA: NEGATIVE
Specific Gravity, UA: >1.03 — ABNORMAL HIGH (ref 1.005–1.030)
Urobilinogen, Ur: 0.2 mg/dL (ref 0.2–1.0)
pH, UA: 5.5 (ref 5.0–7.5)

## 2015-08-08 LAB — COMPREHENSIVE METABOLIC PANEL
ALT: 30 IU/L (ref 0–32)
AST: 20 IU/L (ref 0–40)
Albumin/Globulin Ratio: 1.5 (ref 1.1–2.5)
Albumin: 4.6 g/dL (ref 3.5–5.5)
Alkaline Phosphatase: 98 IU/L (ref 39–117)
BUN/Creatinine Ratio: 14 (ref 8–20)
BUN: 10 mg/dL (ref 6–20)
Bilirubin Total: 0.5 mg/dL (ref 0.0–1.2)
CO2: 20 mmol/L (ref 18–29)
Calcium: 9.4 mg/dL (ref 8.7–10.2)
Chloride: 103 mmol/L (ref 96–106)
Creatinine, Ser: 0.72 mg/dL (ref 0.57–1.00)
GFR calc Af Amer: 137 mL/min/{1.73_m2} (ref >59)
GFR calc non Af Amer: 119 mL/min/{1.73_m2} (ref >59)
Globulin, Total: 3 g/dL (ref 1.5–4.5)
Glucose: 86 mg/dL (ref 65–99)
Potassium: 4.2 mmol/L (ref 3.5–5.2)
Sodium: 140 mmol/L (ref 134–144)
Total Protein: 7.6 g/dL (ref 6.0–8.5)

## 2015-08-08 LAB — AST (SGOT) PICCOLO, WAIVED: AST (SGOT) Piccolo, Waived: 27 U/L (ref 11–38)

## 2015-08-08 LAB — ALT (SGPT) PICCOLO, WAIVED: ALT (SGPT) Piccolo, Waived: 30 U/L (ref 10–47)

## 2015-08-09 LAB — PATHOLOGIST SMEAR REVIEW
Path Rev PLTs: NORMAL
Path Rev RBC: NORMAL
Path Rev WBC: NORMAL

## 2015-08-15 ENCOUNTER — Telehealth: Payer: Self-pay | Admitting: Family Medicine

## 2015-08-15 ENCOUNTER — Ambulatory Visit
Admission: RE | Admit: 2015-08-15 | Discharge: 2015-08-15 | Disposition: A | Discharge status: Home / Self Care | Payer: BLUE CROSS/BLUE SHIELD | Source: Ambulatory Visit | Attending: Family Medicine | Admitting: Family Medicine

## 2015-08-15 DIAGNOSIS — R1011 Right upper quadrant pain: Secondary | ICD-10-CM

## 2015-08-15 NOTE — Assessment & Plan Note (Signed)
Normal [place], get HIDA scan

## 2015-08-15 NOTE — Telephone Encounter (Signed)
Let patient know that her RUQ [place] was negative The next step will be to get a HIDA scan to see if her gallbladder squeezes or functions appropriately when medicine is given to stimulate it If her gallbladder doesn't function properly, she might need it removed If her gallbladder is working fine, we'll keep checking for a cause of her symptoms

## 2015-08-16 NOTE — Assessment & Plan Note (Signed)
Will get labs today, in-house SGOT and SGPT were normal; will get [place] of gallbladder and liver; f/u with HIDA scan or other testing; reasons to go to ER reviewed

## 2015-08-16 NOTE — Telephone Encounter (Signed)
Patient notified

## 2015-08-16 NOTE — Assessment & Plan Note (Signed)
Noted on CBC in-house today; sending for path smear review

## 2015-08-16 NOTE — Telephone Encounter (Signed)
Pt called stated she is returning Amy's call. Please call her ASAP. Thanks.

## 2015-08-16 NOTE — Telephone Encounter (Signed)
Left message to call.

## 2015-08-23 ENCOUNTER — Telehealth: Payer: Self-pay

## 2015-08-23 NOTE — Telephone Encounter (Signed)
She hasn't heard about her HIDA scan appointment at the hospital. Can you check in it? Thanks!

## 2015-08-24 NOTE — Telephone Encounter (Signed)
Referral Generated to [facility].  Will call later on if appointment has not been scheduled.

## 2015-08-29 ENCOUNTER — Emergency Department
Admission: EM | Admit: 2015-08-29 | Discharge: 2015-08-29 | Disposition: A | Discharge status: Home / Self Care | Payer: BLUE CROSS/BLUE SHIELD | Attending: Emergency Medicine | Admitting: Emergency Medicine

## 2015-08-29 ENCOUNTER — Encounter: Payer: Self-pay | Admitting: Emergency Medicine

## 2015-08-29 ENCOUNTER — Emergency Department: Payer: BLUE CROSS/BLUE SHIELD

## 2015-08-29 DIAGNOSIS — G8929 Other chronic pain: Secondary | ICD-10-CM | POA: Insufficient documentation

## 2015-08-29 DIAGNOSIS — R1011 Right upper quadrant pain: Secondary | ICD-10-CM | POA: Diagnosis not present

## 2015-08-29 DIAGNOSIS — Z79899 Other long term (current) drug therapy: Secondary | ICD-10-CM | POA: Insufficient documentation

## 2015-08-29 DIAGNOSIS — R079 Chest pain, unspecified: Secondary | ICD-10-CM | POA: Insufficient documentation

## 2015-08-29 LAB — BASIC METABOLIC PANEL
Anion gap: 7 (ref 5–15)
BUN: 10 mg/dL (ref 6–20)
CO2: 24 mmol/L (ref 22–32)
Calcium: 9.4 mg/dL (ref 8.9–10.3)
Chloride: 109 mmol/L (ref 101–111)
Creatinine, Ser: 0.79 mg/dL (ref 0.44–1.00)
GFR calc Af Amer: >60 mL/min (ref >60)
GFR calc non Af Amer: >60 mL/min (ref >60)
Glucose, Bld: 85 mg/dL (ref 65–99)
Potassium: 3.5 mmol/L (ref 3.5–5.1)
Sodium: 140 mmol/L (ref 135–145)

## 2015-08-29 LAB — CBC
HCT: 36 % (ref 35.0–47.0)
Hemoglobin: 12.2 g/dL (ref 12.0–16.0)
MCH: 27.8 pg (ref 26.0–34.0)
MCHC: 33.9 g/dL (ref 32.0–36.0)
MCV: 82.1 fL (ref 80.0–100.0)
Platelets: 156 10*3/uL (ref 150–440)
RBC: 4.38 MIL/uL (ref 3.80–5.20)
RDW: 13.8 % (ref 11.5–14.5)
WBC: 6.2 10*3/uL (ref 3.6–11.0)

## 2015-08-29 LAB — TROPONIN I: Troponin I: <0.03 ng/mL (ref <0.031)

## 2015-08-29 MED ORDER — FAMOTIDINE 20 MG PO TABS
40.0000 mg | ORAL_TABLET | Freq: Once | ORAL | Status: AC
  Administered 2015-08-29: 40 mg via ORAL
  Filled 2015-08-29: qty 2

## 2015-08-29 MED ORDER — RANITIDINE HCL 150 MG PO CAPS: 150.0000 mg | ORAL_CAPSULE | Freq: Two times a day (BID) | ORAL | Status: DC

## 2015-08-29 NOTE — ED Notes (Signed)
Urine specimen found next to poc preg

## 2015-08-29 NOTE — Discharge Instructions (Signed)
Nonspecific Chest Pain  °Chest pain can be caused by many different conditions. There is always a chance that your pain could be related to something serious, such as a heart attack or a blood clot in your lungs. Chest pain can also be caused by conditions that are not life-threatening. If you have chest pain, it is very important to follow up with your health care provider. °CAUSES  °Chest pain can be caused by: °· Heartburn. °· Pneumonia or bronchitis. °· Anxiety or stress. °· Inflammation around your heart (pericarditis) or lung (pleuritis or pleurisy). °· A blood clot in your lung. °· A collapsed lung (pneumothorax). It can develop suddenly on its own (spontaneous pneumothorax) or from trauma to the chest. °· Shingles infection (varicella-zoster virus). °· Heart attack. °· Damage to the bones, muscles, and cartilage that make up your chest wall. This can include: °¨ Bruised bones due to injury. °¨ Strained muscles or cartilage due to frequent or repeated coughing or overwork. °¨ Fracture to one or more ribs. °¨ Sore cartilage due to inflammation (costochondritis). °RISK FACTORS  °Risk factors for chest pain may include: °· Activities that increase your risk for trauma or injury to your chest. °· Respiratory infections or conditions that cause frequent coughing. °· Medical conditions or overeating that can cause heartburn. °· Heart disease or family history of heart disease. °· Conditions or health behaviors that increase your risk of developing a blood clot. °· Having had chicken pox (varicella zoster). °SIGNS AND SYMPTOMS °Chest pain can feel like: °· Burning or tingling on the surface of your chest or deep in your chest. °· Crushing, pressure, aching, or squeezing pain. °· Dull or sharp pain that is worse when you move, cough, or take a deep breath. °· Pain that is also felt in your back, neck, shoulder, or arm, or pain that spreads to any of these areas. °Your chest pain may come and go, or it may stay  constant. °DIAGNOSIS °Lab tests or other studies may be needed to find the cause of your pain. Your health care provider may have you take a test called an ambulatory ECG (electrocardiogram). An ECG records your heartbeat patterns at the time the test is performed. You may also have other tests, such as: °· Transthoracic echocardiogram (TTE). During echocardiography, sound waves are used to create a picture of all of the heart structures and to look at how blood flows through your heart. °· Transesophageal echocardiogram (TEE). This is a more advanced imaging test that obtains images from inside your body. It allows your health care provider to see your heart in finer detail. °· Cardiac monitoring. This allows your health care provider to monitor your heart rate and rhythm in real time. °· Holter monitor. This is a portable device that records your heartbeat and can help to diagnose abnormal heartbeats. It allows your health care provider to track your heart activity for several days, if needed. °· Stress tests. These can be done through exercise or by taking medicine that makes your heart beat more quickly. °· Blood tests. °· Imaging tests. °TREATMENT  °Your treatment depends on what is causing your chest pain. Treatment may include: °· Medicines. These may include: °¨ Acid blockers for heartburn. °¨ Anti-inflammatory medicine. °¨ Pain medicine for inflammatory conditions. °¨ Antibiotic medicine, if an infection is present. °¨ Medicines to dissolve blood clots. °¨ Medicines to treat coronary artery disease. °· Supportive care for conditions that do not require medicines. This may include: °¨ Resting. °¨ Applying heat   or cold packs to injured areas. °¨ Limiting activities until pain decreases. °HOME CARE INSTRUCTIONS °· If you were prescribed an antibiotic medicine, finish it all even if you start to feel better. °· Avoid any activities that bring on chest pain. °· Do not use any tobacco products, including  cigarettes, chewing tobacco, or electronic cigarettes. If you need help quitting, ask your health care provider. °· Do not drink alcohol. °· Take medicines only as directed by your health care provider. °· Keep all follow-up visits as directed by your health care provider. This is important. This includes any further testing if your chest pain does not go away. °· If heartburn is the cause for your chest pain, you may be told to keep your head raised (elevated) while sleeping. This reduces the chance that acid will go from your stomach into your esophagus. °· Make lifestyle changes as directed by your health care provider. These may include: °¨ Getting regular exercise. Ask your health care provider to suggest some activities that are safe for you. °¨ Eating a heart-healthy diet. A registered dietitian can help you to learn healthy eating options. °¨ Maintaining a healthy weight. °¨ Managing diabetes, if necessary. °¨ Reducing stress. °SEEK MEDICAL CARE IF: °· Your chest pain does not go away after treatment. °· You have a rash with blisters on your chest. °· You have a fever. °SEEK IMMEDIATE MEDICAL CARE IF:  °· Your chest pain is worse. °· You have an increasing cough, or you cough up blood. °· You have severe abdominal pain. °· You have severe weakness. °· You faint. °· You have chills. °· You have sudden, unexplained chest discomfort. °· You have sudden, unexplained discomfort in your arms, back, neck, or jaw. °· You have shortness of breath at any time. °· You suddenly start to sweat, or your skin gets clammy. °· You feel nauseous or you vomit. °· You suddenly feel light-headed or dizzy. °· Your heart begins to beat quickly, or it feels like it is skipping beats. °These symptoms may represent a serious problem that is an emergency. Do not wait to see if the symptoms will go away. Get medical help right away. Call your local emergency services (911 in the [place] Do not drive yourself to the hospital. °  °This  information is not intended to replace advice given to you by your health care provider. Make sure you discuss any questions you have with your health care provider. °  °Document Released: 04/17/2005 Document Revised: 07/29/2014 Document Reviewed: 02/11/2014 °Elsevier Interactive Patient Education ©2016 [employer] ° °

## 2015-08-29 NOTE — ED Notes (Signed)
Pt reports side pain for past couple days, reports today central chest pain started. Reports some SOB

## 2015-08-29 NOTE — ED Provider Notes (Signed)
[facility] Emergency Department Provider Note  ____________________________________________  Time seen: 9:45 PM  I have reviewed the triage vital signs and the nursing notes.   HISTORY  Chief Complaint Chest Pain    HPI Sabrina Mcgee is a 25 y.o. female who complains of chest pain that started at 3:00 PM today. It was gradual in onset, hurting in the left side of the chest radiating to the left back. Like a swelling sensation in her chest. No nausea vomiting shortness of breath diaphoresis. No pleuritic symptoms no exertional symptoms. Never had anything like this before. No recent travel trauma hospitalizations or surgeries. No history of DVT or PE. No exogenous hormone use. The patient actually lasted about 2 hours and has since resolved.  Additionally she is also had chronic right upper quadrant abdominal pain. This is not affected by eating. She is been worked up by her primary care and had unremarkable labs and ultrasound. She is scheduled for a HIDA scan by her doctor. No changes in this pain. No fevers or chills. No changes in stool quality.  Currently asymptomatic   Past Medical History  Diagnosis Date  . Goiter   . Depression   . Anxiety   . Vitamin D deficiency disease   . Anemia      Patient Active Problem List   Diagnosis Date Noted  . Abdominal pain, right upper quadrant 08/07/2015  . Abnormal morphology of platelets 08/07/2015  . Microcytic hypochromic anemia 02/28/2015  . Vitamin D deficiency 02/28/2015  . Fatigue 02/27/2015  . Social anxiety disorder 02/27/2015  . Depression 02/27/2015  . Allergic rhinitis 02/27/2015     History reviewed. No pertinent past surgical history.   Current Outpatient Rx  Name  Route  Sig  Dispense  Refill  . escitalopram (LEXAPRO) 10 MG tablet   Oral   Take 1 tablet (10 mg total) by mouth daily.   30 tablet   6   . Multiple Vitamin (MULTIVITAMIN) tablet   Oral   Take 1 tablet by mouth  daily.         . ranitidine (ZANTAC) 150 MG capsule   Oral   Take 1 capsule (150 mg total) by mouth 2 (two) times daily.   28 capsule   0      Allergies Review of patient's allergies indicates no known allergies.   Family History  Problem Relation Age of Onset  . Heart disease Mother   . Hypertension Mother   . Cancer Father     kidney  . Heart disease Father   . Hypertension Father   . Cancer Paternal Aunt     breast  . Heart disease Maternal Grandmother   . Hypertension Maternal Grandmother   . Heart disease Maternal Grandfather   . Hypertension Maternal Grandfather   . Heart disease Paternal Grandmother   . Hypertension Paternal Grandmother   . COPD Paternal Grandmother   . Cancer Paternal Grandfather     prostate  . Heart disease Paternal Grandfather   . Hypertension Paternal Grandfather   . Diabetes Paternal Aunt     Social History Social History  Substance Use Topics  . Smoking status: Never Smoker   . Smokeless tobacco: Never Used  . Alcohol Use: No    Review of Systems  Constitutional:   No fever or chills. No weight changes Eyes:   No blurry vision or double vision.  ENT:   No sore throat. Cardiovascular:   Positive as above chest pain.  Respiratory:   No dyspnea or cough. Gastrointestinal:   Positive as above abdominal pain without vomiting and diarrhea.  No BRBPR or melena. Genitourinary:   Negative for dysuria, urinary retention, bloody urine, or difficulty urinating. Musculoskeletal:   Negative for back pain. No joint swelling or pain. Skin:   Negative for rash. Neurological:   Negative for headaches, focal weakness or numbness. Psychiatric:  No anxiety or depression.   Endocrine:  No hot/cold intolerance, changes in energy, or sleep difficulty.  10-point ROS otherwise negative.  ____________________________________________   PHYSICAL EXAM:  VITAL SIGNS: ED Triage Vitals  Enc Vitals Group     BP 08/29/15 1840 132/85 mmHg      Pulse Rate 08/29/15 1840 112     Resp 08/29/15 1840 20     Temp 08/29/15 1840 98.3 F (36.8 C)     Temp Source 08/29/15 1840 Oral     SpO2 08/29/15 1840 100 %     Weight 08/29/15 1840 180 lb (81.647 kg)     Height 08/29/15 1840 5\' 7"  (1.702 m)     Head Cir --      Peak Flow --      Pain Score 08/29/15 1841 5     Pain Loc --      Pain Edu? --      Excl. in [place]? --     Vital signs reviewed, nursing assessments reviewed.   Constitutional:   Alert and oriented. Well appearing and in no distress. Eyes:   No scleral icterus. No conjunctival pallor. PERRL. EOMI ENT   Head:   Normocephalic and atraumatic.   Nose:   No congestion/rhinnorhea. No septal hematoma   Mouth/Throat:   MMM, no pharyngeal erythema. No peritonsillar mass. No uvula shift.   Neck:   No stridor. No SubQ emphysema. No meningismus. Hematological/Lymphatic/Immunilogical:   No cervical lymphadenopathy. Cardiovascular:   RRR. Normal and symmetric distal pulses are present in all extremities. No murmurs, rubs, or gallops. Respiratory:   Normal respiratory effort without tachypnea nor retractions. Breath sounds are clear and equal bilaterally. No wheezes/rales/rhonchi. Gastrointestinal:   Soft and nontender. No distention. There is no CVA tenderness.  No rebound, rigidity, or guarding. Genitourinary:   deferred Musculoskeletal:   Nontender with normal range of motion in all extremities. No joint effusions.  No lower extremity tenderness.  No edema. Chest wall nontender. There is some slight tenderness below the left scapula which reproduces her pain somewhat Neurologic:   Normal speech and language.  CN 2-10 normal. Motor grossly intact. No pronator drift.  Normal gait. No gross focal neurologic deficits are appreciated.  Skin:    Skin is warm, dry and intact. No rash noted.  No petechiae, purpura, or bullae. Psychiatric:   Mood and affect are normal. Speech and behavior are normal. Patient exhibits appropriate  insight and judgment.  ____________________________________________    LABS (pertinent positives/negatives) (all labs ordered are listed, but only abnormal results are displayed) Labs Reviewed  BASIC METABOLIC PANEL  TROPONIN I  CBC   ____________________________________________   EKG  Interpreted by me Sinus tachycardia rate 108. Normal axis intervals QRS and ST segments and T waves. No acute ischemic changes, no evidence of right heart strain  ____________________________________________    RADIOLOGY  Chest x-ray unremarkable  ____________________________________________   PROCEDURES   ____________________________________________   INITIAL IMPRESSION / ASSESSMENT AND PLAN / ED COURSE  Pertinent labs & imaging results that were available during my care of the patient were reviewed by me and  considered in my medical decision making (see chart for details).  Patient well appearing no acute distress. Presents with nonspecific chest pain and chronic abdominal pain. Both symptoms have currently resolved.Considering the patient's symptoms, medical history, and physical examination today, I have low suspicion for ACS, PE, TAD, pneumothorax, carditis, mediastinitis, pneumonia, CHF, or sepsis.  Further low suspicion for acute biliary pathology such as cholangitis or cholecystitis. No evidence of pancreatitis perforation or obstruction. At this point I think the most likely cause of her symptoms may be some acid reflux. She does not warrant further workup or hospitalization at this time. We'll start on a course of H2 blocker and have her follow-up with her doctor.     ____________________________________________   FINAL CLINICAL IMPRESSION(S) / ED DIAGNOSES  Final diagnoses:  Nonspecific chest pain      Carrie Mew, MD 08/29/15 2215

## 2015-08-29 NOTE — ED Notes (Signed)
Pt to ed with c/o chest pain that started this afternoon.  Pt also reports pain in right upper quad.  Pt denies n/v/d. Reports mild sob, denies weakness, denies dizziness.

## 2015-09-01 ENCOUNTER — Encounter: Payer: Self-pay | Admitting: Family Medicine

## 2015-09-01 ENCOUNTER — Ambulatory Visit (INDEPENDENT_AMBULATORY_CARE_PROVIDER_SITE_OTHER): Payer: BLUE CROSS/BLUE SHIELD | Admitting: Family Medicine

## 2015-09-01 VITALS — BP 109/73 | HR 87 | Temp 97.3°F | Wt 181.0 lb

## 2015-09-01 DIAGNOSIS — R1011 Right upper quadrant pain: Secondary | ICD-10-CM | POA: Diagnosis not present

## 2015-09-01 DIAGNOSIS — Z23 Encounter for immunization: Secondary | ICD-10-CM

## 2015-09-01 DIAGNOSIS — R0789 Other chest pain: Secondary | ICD-10-CM

## 2015-09-01 NOTE — Progress Notes (Signed)
BP 109/73 mmHg  Pulse 87  Temp(Src) 97.3 F (36.3 C)  Wt 181 lb (82.101 kg)  SpO2 100%  LMP 08/25/2015 (Approximate)   Subjective:    Patient ID: Sabrina Mcgee, female    DOB: March 10, 1991, 25 y.o.   MRN: [id number]  HPI: Sabrina Mcgee is a 25 y.o. female  Chief Complaint  Patient presents with  . Follow-up    ED follow up for chest pain, they gave her an rx for Zantac 150mg  BID but she never filled the rx. She states she is doing better.   She went to the ER on Tuesday with chest pain, squeezing feeling; just driving and not related to panic; came suddenly she says (but ER note says gradually); resolved after two hours; no leg swelling; her chest was sore to the touch; can't remember exactly where but it did hurt to push on an area; now, no SHOB, no chest pain; she has no personal hx of DVT; ER records, CXR, labs reviewed  Lab Results  Component Value Date   TROPONINI <0.03 08/29/2015   Lab Results  Component Value Date   WBC 6.2 08/29/2015   HGB 12.2 08/29/2015   HCT 36.0 08/29/2015   MCV 82.1 08/29/2015   PLT 156 08/29/2015     Chemistry      Component Value Date/Time   NA 140 08/29/2015 1843   NA 140 08/07/2015 1607   K 3.5 08/29/2015 1843   CL 109 08/29/2015 1843   CO2 24 08/29/2015 1843   BUN 10 08/29/2015 1843   BUN 10 08/07/2015 1607   CREATININE 0.79 08/29/2015 1843      Component Value Date/Time   CALCIUM 9.4 08/29/2015 1843   ALKPHOS 98 08/07/2015 1607   AST 20 08/07/2015 1607   ALT 30 08/07/2015 1607   BILITOT 0.5 08/07/2015 1607     CLINICAL DATA: Chest pain and shortness of breath for 1 day  EXAM: CHEST 2 VIEW  COMPARISON: None.  FINDINGS: Lungs are clear. Heart size and pulmonary vascularity are normal. No pneumothorax. No adenopathy. No bone lesions.  IMPRESSION: No abnormality noted.   Electronically Signed  By: Lowella Grip III M.D.  On: 08/29/2015 19:28  Still having RUQ discomfort; that does happen with  eating; does not matter what she eats, sometimes gets nausea, no vomiting; no acid reflux now, but did have that before; she still has not had her HIDA scan ordered in January  CLINICAL DATA: Right upper quadrant pain for 5 days  EXAM: US ABDOMEN LIMITED - RIGHT UPPER QUADRANT  COMPARISON: None.  FINDINGS: Gallbladder:  No gallstones or wall thickening visualized. There is no pericholecystic fluid. No sonographic Murphy sign noted by sonographer.  Common bile duct:  Diameter: 3 mm. There is no intrahepatic or extrahepatic biliary duct dilatation.  Liver:  No focal lesion identified. Within normal limits in parenchymal echogenicity.  IMPRESSION: Study within normal limits.   Electronically Signed  By: Lowella Grip III M.D.  On: 08/15/2015 09:38 Relevant past medical, surgical, family and social history reviewed and updated as indicated; no fam hx of DVT or PE  Interim medical history since our last visit reviewed. Allergies and medications reviewed and updated.  Review of Systems  Per HPI unless specifically indicated above     Objective:    BP 109/73 mmHg  Pulse 87  Temp(Src) 97.3 F (36.3 C)  Wt 181 lb (82.101 kg)  SpO2 100%  LMP 08/25/2015 (Approximate)  Wt Readings from Last 3 Encounters:  09/01/15 181 lb (82.101 kg)  08/29/15 180 lb (81.647 kg)  08/07/15 180 lb (81.647 kg)    Physical Exam  Constitutional: She appears well-developed and well-nourished. No distress.  Eyes: No scleral icterus.  Neck: No JVD present.  Cardiovascular: Normal rate and regular rhythm.   Pulmonary/Chest: Effort normal and breath sounds normal.  Abdominal: Soft. Bowel sounds are normal. She exhibits no distension. There is tenderness (mild RUQ, unable to elicit a positive Murphy's sign).  Musculoskeletal: She exhibits no edema.  Skin: Skin is warm and dry. She is not diaphoretic. No pallor.  Psychiatric: She has a normal mood and affect.   Results  for orders placed or performed during the hospital encounter of [id number]  Basic metabolic panel  Result Value Ref Range   Sodium 140 135 - 145 mmol/L   Potassium 3.5 3.5 - 5.1 mmol/L   Chloride 109 101 - 111 mmol/L   CO2 24 22 - 32 mmol/L   Glucose, Bld 85 65 - 99 mg/dL   BUN 10 6 - 20 mg/dL   Creatinine, Ser 0.79 0.44 - 1.00 mg/dL   Calcium 9.4 8.9 - 10.3 mg/dL   GFR calc non Af Amer >60 >60 mL/min   GFR calc Af Amer >60 >60 mL/min   Anion gap 7 5 - 15  Troponin I  Result Value Ref Range   Troponin I <0.03 <0.031 ng/mL  CBC  Result Value Ref Range   WBC 6.2 3.6 - 11.0 K/uL   RBC 4.38 3.80 - 5.20 MIL/uL   Hemoglobin 12.2 12.0 - 16.0 g/dL   HCT 36.0 35.0 - 47.0 %   MCV 82.1 80.0 - 100.0 fL   MCH 27.8 26.0 - 34.0 pg   MCHC 33.9 32.0 - 36.0 g/dL   RDW 13.8 11.5 - 14.5 %   Platelets 156 150 - 440 K/uL      Assessment & Plan:   Problem List Items Addressed This Visit      Other   Abdominal pain, right upper quadrant    Patient still has not had her HIDA scan; I talked to staff and got this ordered; she will go next week for the HIDA scan; discussed reasons to go to the ER in the meantime if pain worsens; encouraged small meals, low in fat       Other Visit Diagnoses    Chest pain, atypical    -  Primary    seen in ER earlier this week; symptoms completely resolved; reviewed records; tender to touch at time, suggestive of MSK etiology     Need for Tdap vaccination        given today    Relevant Orders    Tdap vaccine greater than or equal to 7yo IM (Completed)       Follow up plan: Return in about 10 days (around 09/11/2015) for recheck of symptoms if needed.  An after-visit summary was printed and given to the patient at [facility].  Please see the patient instructions which may contain other information and recommendations beyond what is mentioned above in the assessment and plan.  Face-to-face time with patient was more than 25 minutes, >50% time spent counseling and  coordination of care

## 2015-09-01 NOTE — Patient Instructions (Addendum)
Please do have that scan done next week Limit fatty, greasy foods and try to eat in smaller meals/snacks instead of big meals Continue vitamin D 1000 to 2000 iu daily Do go to the ER if symptoms worsen or chest pain returns Return in 10 days for further work-up or testing, unless the HIDA scan gives [place] our answer You received the vaccine to protect against tetanus and diphtheria and pertussis today; the tetanus and diphtheria portions will provide protection up to ten years, and the pertussis component will give you protection against whooping cough for life

## 2015-09-03 NOTE — Assessment & Plan Note (Signed)
Patient still has not had her HIDA scan; I talked to staff and got this ordered; she will go next week for the HIDA scan; discussed reasons to go to the ER in the meantime if pain worsens; encouraged small meals, low in fat

## 2015-09-08 ENCOUNTER — Telehealth: Payer: Self-pay | Admitting: Family Medicine

## 2015-09-08 ENCOUNTER — Ambulatory Visit (HOSPITAL_COMMUNITY)
Admission: RE | Admit: 2015-09-08 | Discharge: 2015-09-08 | Disposition: A | Discharge status: Home / Self Care | Payer: BLUE CROSS/BLUE SHIELD | Source: Ambulatory Visit | Attending: Family Medicine | Admitting: Family Medicine

## 2015-09-08 DIAGNOSIS — R1011 Right upper quadrant pain: Secondary | ICD-10-CM | POA: Diagnosis present

## 2015-09-08 DIAGNOSIS — R932 Abnormal findings on diagnostic imaging of liver and biliary tract: Secondary | ICD-10-CM | POA: Insufficient documentation

## 2015-09-08 DIAGNOSIS — R948 Abnormal results of function studies of other organs and systems: Secondary | ICD-10-CM | POA: Insufficient documentation

## 2015-09-08 MED ORDER — TECHNETIUM TC 99M MEBROFENIN IV KIT
5.2000 | PACK | Freq: Once | INTRAVENOUS | Status: AC | PRN
  Administered 2015-09-08: 5 via INTRAVENOUS

## 2015-09-08 NOTE — Telephone Encounter (Signed)
HIDA scan shows low EF: "Abnormally low gallbladder ejection fraction at 31%" I called, left msg that I have news about her scan, would like to talk with her by the end of the day; please call back

## 2015-09-11 ENCOUNTER — Ambulatory Visit: Payer: BLUE CROSS/BLUE SHIELD | Admitting: Family Medicine

## 2015-09-11 ENCOUNTER — Telehealth: Payer: Self-pay | Admitting: Family Medicine

## 2015-09-11 NOTE — Telephone Encounter (Signed)
Routing to Dr. Lada 

## 2015-09-11 NOTE — Telephone Encounter (Signed)
Pt would like a call back to discuss scan results

## 2015-09-11 NOTE — Telephone Encounter (Signed)
I talked with patient about her scan (see other phone note); explained gallbladder just not quite working well, but very mild; avoid fatty meals, refer to surgeon; call with anything new or any worsenng sx

## 2015-09-13 ENCOUNTER — Ambulatory Visit: Payer: BLUE CROSS/BLUE SHIELD

## 2015-09-18 ENCOUNTER — Ambulatory Visit (INDEPENDENT_AMBULATORY_CARE_PROVIDER_SITE_OTHER): Payer: BLUE CROSS/BLUE SHIELD | Admitting: General Surgery

## 2015-09-18 ENCOUNTER — Encounter: Payer: Self-pay | Admitting: General Surgery

## 2015-09-18 VITALS — BP 128/84 | HR 97 | Temp 97.9°F | Ht 67.0 in | Wt 185.0 lb

## 2015-09-18 DIAGNOSIS — R1011 Right upper quadrant pain: Secondary | ICD-10-CM | POA: Diagnosis not present

## 2015-09-18 NOTE — Progress Notes (Signed)
Patient ID: Sabrina Mcgee, female   DOB: 1991-05-03, 25 y.o.   MRN: [id number]  CC: RUQ Abdominal pain  HPI Sabrina Mcgee is a 25 y.o. female presents to clinic for evaluation of a 2 month history of abdominal pain. Patient states over the last month she had an ultrasound which did not show any gallstones and then had a HIDA scan for evaluation of her abdominal pain. The HIDA scan did cause her symptoms to occur. She states that after eating she developed some right upper quadrant pain she had some subjective nausea about 3 weeks ago but no vomiting. The pain has come and gone and very spent on what she eats. She denies any fevers, chills, chest pain, shortness of breath, diarrhea, constipation. She is otherwise in her usual state of excellent health.  HPI  Past Medical History  Diagnosis Date  . Goiter   . Depression   . Anxiety   . Vitamin D deficiency disease   . Anemia     Past Surgical History  Procedure Laterality Date  . Tonsillectomy  2004    Family History  Problem Relation Age of Onset  . Heart disease Mother   . Hypertension Mother   . Cancer Father     kidney  . Heart disease Father   . Hypertension Father   . Cancer Paternal Aunt     breast  . Heart disease Maternal Grandmother   . Hypertension Maternal Grandmother   . Heart disease Maternal Grandfather   . Hypertension Maternal Grandfather   . Heart disease Paternal Grandmother   . Hypertension Paternal Grandmother   . COPD Paternal Grandmother   . Cancer Paternal Grandfather     prostate  . Heart disease Paternal Grandfather   . Hypertension Paternal Grandfather   . Diabetes Paternal Aunt     Social History Social History  Substance Use Topics  . Smoking status: Never Smoker   . Smokeless tobacco: Never Used  . Alcohol Use: No    No Known Allergies  Current Outpatient Prescriptions  Medication Sig Dispense Refill  . escitalopram (LEXAPRO) 10 MG tablet Take 1 tablet (10 mg total) by mouth  daily. 30 tablet 6  . Multiple Vitamin (MULTIVITAMIN) tablet Take 1 tablet by mouth daily.     No current facility-administered medications for this visit.     Review of Systems A multi-point review of systems was asked and was negative except for the findings are dictated in the history of present illness  Physical Exam Blood pressure 128/84, pulse 97, temperature 97.9 F (36.6 C), temperature source Oral, height 5\' 7"  (1.702 m), weight 83.915 kg (185 lb), last menstrual period 08/25/2015. CONSTITUTIONAL: No acute distress. EYES: Pupils are equal, round, and reactive to light, Sclera are non-icteric. EARS, NOSE, MOUTH AND THROAT: The oropharynx is clear. The oral mucosa is pink and moist. Hearing is intact to voice. LYMPH NODES:  Lymph nodes in the neck are normal. RESPIRATORY:  Lungs are clear. There is normal respiratory effort, with equal breath sounds bilaterally, and without pathologic use of accessory muscles. CARDIOVASCULAR: Heart is regular without murmurs, gallops, or rubs. GI: The abdomen is soft, nontender, and nondistended. There are no palpable masses. There is no hepatosplenomegaly. There are normal bowel sounds in all quadrants. GU: Rectal deferred.   MUSCULOSKELETAL: Normal muscle strength and tone. No cyanosis or edema.   SKIN: Turgor is good and there are no pathologic skin lesions or ulcers. NEUROLOGIC: Motor and sensation is grossly normal. Cranial  nerves are grossly intact. PSYCH:  Oriented to person, place and time. Affect is normal.  Data Reviewed I reviewed the patient's images and labs, labs are all within normal limits. Ultrasound does not show any evidence of biliary disease, HIDA scan shows a mildly diminished ejection fraction at 31% I have personally reviewed the patient's imaging, laboratory findings and medical records.    Assessment    Biliary dyskinesia    Plan    Discussed the diagnosis of biliary dyskinesia with the patient and her mother.  Discussed that it is not 100% correlation between biliary dyskinesia and abdominal pain however the fact that her HIDA scan reproduced her pain made it a probable cause of her postprandial pain. Patient and mother voiced understanding centigrade research this prior to coming to see me.I discussed the procedure of a laparoscopic versus robotic cholecystectomy in detail.  The patient was given Neurosurgeon.  We discussed the risks and benefits of a laparoscopic cholecystectomy and possible cholangiogram including, but not limited to bleeding, infection, injury to surrounding structures such as the intestine or liver, bile leak, retained gallstones, need to convert to an open procedure, prolonged diarrhea, blood clots such as  DVT, common bile duct injury, anesthesia risks, and possible need for additional procedures.  The likelihood of improvement in symptoms and return to the patient's normal status is good. We discussed the typical post-operative recovery course. Patient and mother voiced understanding and desire to proceed with a robotic assisted laparoscopic cholecystectomy. Plan for surgery on March 21.      Time spent with the patient was 55 minutes, with more than 50% of the time spent in face-to-face education, counseling and care coordination.     Sabrina Pert, Sabrina Mcgee FACS General Surgeon 09/18/2015, 2:58 PM

## 2015-09-18 NOTE — Patient Instructions (Signed)
We will see you on 10/10/2015 for your Robotic Cholecystectomy by Dr. Adonis Huguenin.

## 2015-09-19 ENCOUNTER — Telehealth: Payer: Self-pay | Admitting: General Surgery

## 2015-09-19 NOTE — Telephone Encounter (Signed)
I have called Pt to advise her of pre op date/time and sx date. No answer. I have left a message on voicemail.  Sx: 10/10/15 with Dr Eddie Dibbles assisted laparoscopic cholecystectomy.  Pre op: 10/02/15 @ 9:00 in [facility] 2nd Floor.   Patient made aware to call [phone], between 1-3:00pm the day before surgery, to find out what time to arrive.

## 2015-09-21 NOTE — Telephone Encounter (Signed)
Patient returned phone call and I explained her surgery and pre op times to her.

## 2015-10-02 ENCOUNTER — Encounter
Admission: RE | Admit: 2015-10-02 | Discharge: 2015-10-02 | Disposition: A | Discharge status: Home / Self Care | Payer: BLUE CROSS/BLUE SHIELD | Source: Ambulatory Visit | Attending: General Surgery | Admitting: General Surgery

## 2015-10-02 DIAGNOSIS — Z01818 Encounter for other preprocedural examination: Secondary | ICD-10-CM | POA: Insufficient documentation

## 2015-10-02 HISTORY — DX: Family history of other specified conditions: Z84.89

## 2015-10-02 NOTE — Patient Instructions (Signed)
  Your procedure is scheduled on: Tuesday October 10, 2015. Report to Same Day Surgery. To find out your arrival time please call [phone] between 1PM - 3PM on Monday October 09, 2015.  Remember: Instructions that are not followed completely may result in serious medical risk, up to and including death, or upon the discretion of your surgeon and anesthesiologist your surgery may need to be rescheduled.    _x___ 1. Do not eat food or drink liquids after midnight. No gum chewing or hard candies.     ____ 2. No Alcohol for 24 hours before or after surgery.   ____ 3. Bring all medications with you on the day of surgery if instructed.    __x__ 4. Notify your doctor if there is any change in your medical condition     (cold, fever, infections).     Do not wear jewelry, make-up, hairpins, clips or nail polish.  Do not wear lotions, powders, or perfumes. You may wear deodorant.  Do not shave 48 hours prior to surgery. Men may shave face and neck.  Do not bring valuables to the hospital.    [facility] is not responsible for any belongings or valuables.               Contacts, dentures or bridgework may not be worn into surgery.  Leave your suitcase in the car. After surgery it may be brought to your room.  For patients admitted to the hospital, discharge time is determined by your treatment team.   Patients discharged the day of surgery will not be allowed to drive home.    Please read over the following fact sheets that you were given:   [facility] Preparing for Surgery  ____ Take these medicines the morning of surgery with A SIP OF WATER: none    ____ Fleet Enema (as directed)   __x__ Use CHG Soap as directed  ____ Use inhalers on the day of surgery  ____ Stop metformin 2 days prior to surgery    ____ Take 1/2 of usual insulin dose the night before surgery and none on the morning of surgery.   ____ Stop Coumadin/Plavix/aspirin on does not apply.  __x__ Stop  Anti-inflammatories on Aleve, Ibuprofen, Advil, Motrin now.OK to take Tylenol.  __x__ Stop supplements multivitamin now until after surgery.    ____ Bring C-Pap to the hospital.

## 2015-10-10 ENCOUNTER — Ambulatory Visit: Payer: BLUE CROSS/BLUE SHIELD | Admitting: Anesthesiology

## 2015-10-10 ENCOUNTER — Encounter: Payer: Self-pay | Admitting: *Deleted

## 2015-10-10 ENCOUNTER — Ambulatory Visit
Admission: RE | Admit: 2015-10-10 | Discharge: 2015-10-10 | Disposition: A | Discharge status: Home / Self Care | Payer: BLUE CROSS/BLUE SHIELD | Source: Ambulatory Visit | Attending: General Surgery | Admitting: General Surgery

## 2015-10-10 ENCOUNTER — Encounter
Admission: RE | Disposition: A | Discharge status: Home / Self Care | Payer: Self-pay | Source: Ambulatory Visit | Attending: General Surgery

## 2015-10-10 DIAGNOSIS — Z79899 Other long term (current) drug therapy: Secondary | ICD-10-CM | POA: Insufficient documentation

## 2015-10-10 DIAGNOSIS — F419 Anxiety disorder, unspecified: Secondary | ICD-10-CM | POA: Insufficient documentation

## 2015-10-10 DIAGNOSIS — K811 Chronic cholecystitis: Secondary | ICD-10-CM | POA: Insufficient documentation

## 2015-10-10 DIAGNOSIS — F329 Major depressive disorder, single episode, unspecified: Secondary | ICD-10-CM | POA: Diagnosis not present

## 2015-10-10 DIAGNOSIS — K8050 Calculus of bile duct without cholangitis or cholecystitis without obstruction: Secondary | ICD-10-CM | POA: Diagnosis not present

## 2015-10-10 HISTORY — PX: ROBOTIC ASSISTED LAPAROSCOPIC CHOLECYSTECTOMY: SHX6521

## 2015-10-10 LAB — POCT PREGNANCY, URINE: Preg Test, Ur: NEGATIVE

## 2015-10-10 SURGERY — ROBOTIC ASSISTED LAPAROSCOPIC CHOLECYSTECTOMY
Anesthesia: General | Wound class: Clean Contaminated

## 2015-10-10 MED ORDER — LIDOCAINE HCL (PF) 1 % IJ SOLN
INTRAMUSCULAR | Status: AC
  Filled 2015-10-10: qty 30

## 2015-10-10 MED ORDER — CHLORHEXIDINE GLUCONATE 4 % EX LIQD: 1.0000 "application " | Freq: Once | CUTANEOUS | Status: DC

## 2015-10-10 MED ORDER — ONDANSETRON HCL 4 MG/2ML IJ SOLN: 4.0000 mg | Freq: Once | INTRAMUSCULAR | Status: DC | PRN

## 2015-10-10 MED ORDER — BUPIVACAINE HCL (PF) 0.5 % IJ SOLN
INTRAMUSCULAR | Status: AC
  Filled 2015-10-10: qty 30

## 2015-10-10 MED ORDER — PROPOFOL 10 MG/ML IV BOLUS
INTRAVENOUS | Status: DC | PRN
  Administered 2015-10-10: 180 mg via INTRAVENOUS

## 2015-10-10 MED ORDER — ACETAMINOPHEN 10 MG/ML IV SOLN
INTRAVENOUS | Status: AC
  Filled 2015-10-10: qty 100

## 2015-10-10 MED ORDER — OXYCODONE-ACETAMINOPHEN 5-325 MG PO TABS: 1.0000 | ORAL_TABLET | ORAL | Status: DC | PRN

## 2015-10-10 MED ORDER — FAMOTIDINE 20 MG PO TABS
20.0000 mg | ORAL_TABLET | Freq: Once | ORAL | Status: AC
  Administered 2015-10-10: 20 mg via ORAL

## 2015-10-10 MED ORDER — CEFOXITIN SODIUM-DEXTROSE 2-2.2 GM-% IV SOLR (PREMIX)
INTRAVENOUS | Status: AC
  Filled 2015-10-10: qty 50

## 2015-10-10 MED ORDER — ROCURONIUM BROMIDE 100 MG/10ML IV SOLN
INTRAVENOUS | Status: DC | PRN
  Administered 2015-10-10: 10 mg via INTRAVENOUS
  Administered 2015-10-10: 50 mg via INTRAVENOUS

## 2015-10-10 MED ORDER — LACTATED RINGERS IV SOLN
INTRAVENOUS | Status: DC
  Administered 2015-10-10 (×2): via INTRAVENOUS

## 2015-10-10 MED ORDER — MIDAZOLAM HCL 2 MG/2ML IJ SOLN
INTRAMUSCULAR | Status: DC | PRN
  Administered 2015-10-10: 2 mg via INTRAVENOUS

## 2015-10-10 MED ORDER — FENTANYL CITRATE (PF) 100 MCG/2ML IJ SOLN: 25.0000 ug | INTRAMUSCULAR | Status: DC | PRN

## 2015-10-10 MED ORDER — ACETAMINOPHEN 10 MG/ML IV SOLN
INTRAVENOUS | Status: DC | PRN
  Administered 2015-10-10: 1000 mg via INTRAVENOUS

## 2015-10-10 MED ORDER — LIDOCAINE HCL 1 % IJ SOLN
INTRAMUSCULAR | Status: DC | PRN
  Administered 2015-10-10: 30 mL via SUBCUTANEOUS

## 2015-10-10 MED ORDER — FAMOTIDINE 20 MG PO TABS
ORAL_TABLET | ORAL | Status: AC
  Administered 2015-10-10: 20 mg via ORAL
  Filled 2015-10-10: qty 1

## 2015-10-10 MED ORDER — DEXTROSE 5 % IV SOLN
1.0000 g | INTRAVENOUS | Status: AC
  Administered 2015-10-10: 1 g via INTRAVENOUS
  Filled 2015-10-10: qty 1

## 2015-10-10 MED ORDER — GLYCOPYRROLATE 0.2 MG/ML IJ SOLN
INTRAMUSCULAR | Status: DC | PRN
  Administered 2015-10-10: 0.4 mg via INTRAVENOUS

## 2015-10-10 MED ORDER — KETOROLAC TROMETHAMINE 30 MG/ML IJ SOLN
INTRAMUSCULAR | Status: DC | PRN
  Administered 2015-10-10: 30 mg via INTRAVENOUS

## 2015-10-10 MED ORDER — DEXAMETHASONE SODIUM PHOSPHATE 4 MG/ML IJ SOLN
INTRAMUSCULAR | Status: DC | PRN
  Administered 2015-10-10: 10 mg via INTRAVENOUS

## 2015-10-10 MED ORDER — FENTANYL CITRATE (PF) 100 MCG/2ML IJ SOLN
INTRAMUSCULAR | Status: DC | PRN
  Administered 2015-10-10: 150 ug via INTRAVENOUS
  Administered 2015-10-10: 100 ug via INTRAVENOUS

## 2015-10-10 MED ORDER — NEOSTIGMINE METHYLSULFATE 10 MG/10ML IV SOLN
INTRAVENOUS | Status: DC | PRN
  Administered 2015-10-10: 3 mg via INTRAVENOUS

## 2015-10-10 MED ORDER — PHENYLEPHRINE HCL 10 MG/ML IJ SOLN
INTRAMUSCULAR | Status: DC | PRN
  Administered 2015-10-10: 200 ug via INTRAVENOUS
  Administered 2015-10-10: 100 ug via INTRAVENOUS
  Administered 2015-10-10: 200 ug via INTRAVENOUS

## 2015-10-10 MED ORDER — LIDOCAINE HCL (CARDIAC) 20 MG/ML IV SOLN
INTRAVENOUS | Status: DC | PRN
  Administered 2015-10-10: 80 mg via INTRAVENOUS
  Administered 2015-10-10: 100 mg via INTRAVENOUS

## 2015-10-10 SURGICAL SUPPLY — 41 items
ADHESIVE MASTISOL STRL (MISCELLANEOUS) IMPLANT
BLADE SURG SZ11 CARB STEEL (BLADE) ×2 IMPLANT
CANISTER SUCT 1200ML W/VALVE (MISCELLANEOUS) ×2 IMPLANT
CHLORAPREP W/TINT 26ML (MISCELLANEOUS) ×2 IMPLANT
CLIP LIGATING HEMO O LOK GREEN (MISCELLANEOUS) ×4 IMPLANT
CORD BIP STRL DISP 12FT (MISCELLANEOUS) ×2 IMPLANT
COVER TIP SHEARS 8 DVNC (MISCELLANEOUS) ×1 IMPLANT
COVER TIP SHEARS 8MM DA VINCI (MISCELLANEOUS) ×1
DEFOGGER SCOPE WARMER CLEARIFY (MISCELLANEOUS) ×2 IMPLANT
DRAPE 3 ARM ACCESS DA VINCI (DRAPES) ×1
DRAPE 3 ARM ACCESS DVNC (DRAPES) ×1 IMPLANT
DRESSING TELFA 4X3 1S ST N-ADH (GAUZE/BANDAGES/DRESSINGS) ×2 IMPLANT
DRSG TEGADERM 4X4.75 (GAUZE/BANDAGES/DRESSINGS) ×8 IMPLANT
ELECT REM PT RETURN 9FT ADLT (ELECTROSURGICAL) ×2
ELECTRODE REM PT RTRN 9FT ADLT (ELECTROSURGICAL) ×1 IMPLANT
GLOVE BIO SURGEON STRL SZ7.5 (GLOVE) ×12 IMPLANT
GLOVE INDICATOR 8.0 STRL GRN (GLOVE) ×4 IMPLANT
GOWN STRL REUS W/ TWL LRG LVL3 (GOWN DISPOSABLE) ×4 IMPLANT
GOWN STRL REUS W/TWL LRG LVL3 (GOWN DISPOSABLE) ×4
IRRIGATION STRYKERFLOW (MISCELLANEOUS) IMPLANT
IRRIGATOR STRYKERFLOW (MISCELLANEOUS)
IV NS 1000ML (IV SOLUTION) ×1
IV NS 1000ML BAXH (IV SOLUTION) ×1 IMPLANT
KIT PINK PAD W/HEAD ARE REST (MISCELLANEOUS) ×2
KIT PINK PAD W/HEAD ARM REST (MISCELLANEOUS) ×1 IMPLANT
L-HOOK LAP DISP 36CM (ELECTROSURGICAL)
LHOOK LAP DISP 36CM (ELECTROSURGICAL) IMPLANT
NDL SAFETY 22GX1.5 (NEEDLE) ×2 IMPLANT
NEEDLE VERESS 14GA 120MM (NEEDLE) ×2 IMPLANT
NS IRRIG 500ML POUR BTL (IV SOLUTION) ×2 IMPLANT
PACK LAP CHOLECYSTECTOMY (MISCELLANEOUS) ×2 IMPLANT
PENCIL ELECTRO HAND CTR (MISCELLANEOUS) ×2 IMPLANT
POUCH ENDO CATCH 10MM SPEC (MISCELLANEOUS) ×2 IMPLANT
SCISSORS METZENBAUM CVD 33 (INSTRUMENTS) IMPLANT
SOLUTION ELECTROLUBE (MISCELLANEOUS) ×2 IMPLANT
SUT MNCRL 4-0 (SUTURE) ×1
SUT MNCRL 4-0 27XMFL (SUTURE) ×1
SUTURE MNCRL 4-0 27XMF (SUTURE) ×1 IMPLANT
TROCAR XCEL 12X100 BLDLESS (ENDOMECHANICALS) ×2 IMPLANT
TROCAR XCEL NON-BLD 5MMX100MML (ENDOMECHANICALS) ×2 IMPLANT
TUBING INSUFFLATOR HI FLOW (MISCELLANEOUS) ×2 IMPLANT

## 2015-10-10 NOTE — Discharge Instructions (Signed)
Laparoscopic Cholecystectomy, Care After Refer to this sheet in the next few weeks. These instructions provide you with information about caring for yourself after your procedure. Your health care provider may also give you more specific instructions. Your treatment has been planned according to current medical practices, but problems sometimes occur. Call your health care provider if you have any problems or questions after your procedure. WHAT TO EXPECT AFTER THE PROCEDURE After your procedure, it is common to have:  Pain at your incision sites. You will be given pain medicines to control your pain.  Mild nausea or vomiting. This should improve after the first 24 hours.  Bloating and possible shoulder pain from the gas that was used during the procedure. This will improve after the first 24 hours. HOME CARE INSTRUCTIONS Incision Care  Follow instructions from your health care provider about how to take care of your incisions. Make sure you:  Wash your hands with soap and water before you change your bandage (dressing). If soap and water are not available, use hand sanitizer.  Change your dressing as told by your health care provider.  Leave stitches (sutures), skin glue, or adhesive strips in place. These skin closures may need to be in place for 2 weeks or longer. If adhesive strip edges start to loosen and curl up, you may trim the loose edges. Do not remove adhesive strips completely unless your health care provider tells you to do that.  Do not take baths, swim, or use a hot tub until your health care provider approves. Ask your health care provider if you can take showers. You may only be allowed to take sponge baths for bathing. General Instructions  Take over-the-counter and prescription medicines only as told by your health care provider.  Do not drive or operate heavy machinery while taking prescription pain medicine.  Return to your normal diet as told by your health care  provider.  Do not lift anything that is heavier than 10 lb (4.5 kg).  Do not play contact sports for one week or until your health care provider approves. SEEK MEDICAL CARE IF:   You have redness, swelling, or pain at the site of your incision.  You have fluid, blood, or pus coming from your incision.  You notice a bad smell coming from your incision area.  Your surgical incisions break open.  You have a fever. SEEK IMMEDIATE MEDICAL CARE IF:  You develop a rash.  You have difficulty breathing.  You have chest pain.  You have increasing pain in your shoulders (shoulder strap areas).  You faint or have dizzy episodes while you are standing.  You have severe pain in your abdomen.  You have nausea or vomiting that lasts for more than one day.   This information is not intended to replace advice given to you by your health care provider. Make sure you discuss any questions you have with your health care provider.   Document Released: 07/08/2005 Document Revised: 03/29/2015 Document Reviewed: 02/17/2013 Elsevier Interactive Patient Education 2016 [place]   1) The drugs that you were given will stay in your system until tomorrow so for the next 24 hours you should not:  A) Drive an automobile B) Make any legal decisions C) Drink any alcoholic beverage   2) You may resume regular meals tomorrow.  Today it is better to start with liquids and gradually work up to solid foods.  You may eat anything you prefer, but  it is better to start with liquids, then soup and crackers, and gradually work up to solid foods.   3) Please notify your doctor immediately if you have any unusual bleeding, trouble breathing, redness and pain at the surgery site, drainage, fever, or pain not relieved by medication.    4) Additional Instructions:        Please contact your physician with any problems or Same Day Surgery at  [phone], Monday through Friday 6 am to 4 pm, or Standard at [facility] number at [phone].

## 2015-10-10 NOTE — Interval H&P Note (Signed)
History and Physical Interval Note:  10/10/2015 11:27 AM  Sabrina Mcgee  has presented today for surgery, with the diagnosis of ABDOMINAL PAIN  The various methods of treatment have been discussed with the patient and family. After consideration of risks, benefits and other options for treatment, the patient has consented to  Procedure(s): ROBOTIC ASSISTED LAPAROSCOPIC CHOLECYSTECTOMY (N/A) as a surgical intervention .  The patient's history has been reviewed, patient examined, no change in status, stable for surgery.  I have reviewed the patient's chart and labs.  Questions were answered to the patient's satisfaction.     Clayburn Pert

## 2015-10-10 NOTE — OR Nursing (Signed)
Time out was at 1332

## 2015-10-10 NOTE — Brief Op Note (Signed)
10/10/2015  2:55 PM  PATIENT:  Sabrina Mcgee  25 y.o. female  PRE-OPERATIVE DIAGNOSIS:  ABDOMINAL PAIN  POST-OPERATIVE DIAGNOSIS:  Chronic Cholecystitis  PROCEDURE:  Procedure(s): ROBOTIC ASSISTED LAPAROSCOPIC CHOLECYSTECTOMY (N/A)  SURGEON:  Surgeon(s) and Role:    * Clayburn Pert, MD - Primary  PHYSICIAN ASSISTANT:   ASSISTANTS: none   ANESTHESIA:   general  EBL:  Total I/O In: 1000 [I.V.:1000] Out: 10 [Blood:10]  BLOOD ADMINISTERED:none  DRAINS: none   LOCAL MEDICATIONS USED:  MARCAINE   , XYLOCAINE  and Amount: 30 ml  SPECIMEN:  Source of Specimen:  gallbladder  DISPOSITION OF SPECIMEN:  PATHOLOGY  COUNTS:  YES  TOURNIQUET:  * No tourniquets in log *  DICTATION: .Dragon Dictation  PLAN OF CARE: Discharge to home after PACU  PATIENT DISPOSITION:  PACU - hemodynamically stable.   Delay start of Pharmacological VTE agent (>24hrs) due to surgical blood loss or risk of bleeding: not applicable

## 2015-10-10 NOTE — Anesthesia Procedure Notes (Signed)
Procedure Name: Intubation Date/Time: 10/10/2015 1:11 PM Performed by: Rosaria Ferries, Erva Koke Pre-anesthesia Checklist: Patient identified, Emergency Drugs available, Suction available and Patient being monitored Patient Re-evaluated:Patient Re-evaluated prior to inductionOxygen Delivery Method: Circle system utilized Preoxygenation: Pre-oxygenation with 100% oxygen Intubation Type: IV induction Laryngoscope Size: Mac and 3 Grade View: Grade I Tube type: Oral Number of attempts: 1 Placement Confirmation: ETT inserted through vocal cords under direct vision,  positive ETCO2 and breath sounds checked- equal and bilateral Secured at: 21 cm Tube secured with: Tape Dental Injury: Teeth and Oropharynx as per pre-operative assessment

## 2015-10-10 NOTE — Transfer of Care (Signed)
Immediate Anesthesia Transfer of Care Note  Patient: Sabrina Mcgee  Procedure(s) Performed: Procedure(s): ROBOTIC ASSISTED LAPAROSCOPIC CHOLECYSTECTOMY (N/A)  Patient Location: PACU  Anesthesia Type:General  Level of Consciousness: awake, alert , oriented and patient cooperative  Airway & Oxygen Therapy: Patient Spontanous Breathing and Patient connected to nasal cannula oxygen  Post-op Assessment: Report given to RN and Post -op Vital signs reviewed and stable  Post vital signs: Reviewed and stable  Last Vitals:  Filed Vitals:   10/10/15 1057  BP: 121/71  Temp: 36.9 C  Resp: 16    Complications: No apparent anesthesia complications

## 2015-10-10 NOTE — Op Note (Signed)
Robotic Assisted Laparoscopic Cholecystectomy  Pre-operative Diagnosis: Biliary Colic  Post-operative Diagnosis: Chronic Cholecystitis  Procedure: Robotic Assisted Laparoscopic Cholecystectomy  Surgeon: Juanda Crumble T. Adonis Huguenin, MD FACS  Anesthesia: Gen. with endotracheal tube  Assistant:none  Procedure Details  The patient was seen again in the Holding Room. The benefits, complications, treatment options, and expected outcomes were discussed with the patient. The risks of bleeding, infection, recurrence of symptoms, failure to resolve symptoms, bile duct damage, bile duct leak, retained common bile duct stone, bowel injury, any of which could require further surgery and/or ERCP, stent, or papillotomy were reviewed with the patient. The likelihood of improving the patient's symptoms with return to their baseline status is good.  The patient and/or family concurred with the proposed plan, giving informed consent.  The patient was taken to Operating Room, identified as Sabrina Mcgee and the procedure verified as Laparoscopic Cholecystectomy.  A Time Out was held and the above information confirmed.  Prior to the induction of general anesthesia, antibiotic prophylaxis was administered. VTE prophylaxis was in place. General endotracheal anesthesia was then administered and tolerated well. After the induction, the abdomen was prepped with Chloraprep and draped in the sterile fashion. The patient was positioned in the supine position.  Local anesthetic  was injected into the skin near the umbilicus and an incision made. The Veress needle was placed. Pneumoperitoneum was then created with CO2 and tolerated well without any adverse changes in the patient's vital signs. A 12 mm port was placed in the periumbilical position and the abdominal cavity was explored.  Two 8-mm ports were placed 10 cm from the umbilicus to the right and left and a 5 mm right lower quadrant port was placed all under direct vision.  All skin incisions  were infiltrated with a local anesthetic agent before making the incision and placing the trocars.   The patient was positioned  in reverse Trendelenburg, tilted slightly to the patient's left.  The DaVinci system was then docked to placed ports and the operative trocars were then placed with instruments in the operative field under direct visualization. The gallbladder was identified, the fundus grasped and retracted cephalad. At this point I broke scrub and proceeded to the console.  Adhesions were lysed bluntly. The infundibulum was grasped and retracted laterally, exposing the peritoneum overlying the triangle of Calot. The patient was noted to have extensive adhesions of the inflammatory nature between the gallbladder and the duodenum. These adhesions went from the midbody of the gallbladder down to the infundibulum. These adhesions were lysed using accommodation of blunt dissection and electrocautery. A critical view of the cystic duct and cystic artery was obtained.  The cystic duct was clearly identified and bluntly dissected. Both the cystic artery and cystic duct were clipped with hemo-lock clips.  The gallbladder was taken from the gallbladder fossa in a retrograde fashion with the electrocautery.  the posterior wall the gallbladder was entered into spilling bile but without gallstones. Once the gallbladder was freed from all its attachments to the liver it was placed over the liver in the right upper quadrant from the assistant trocar. I then re-scrubbed and return to the patient's side after removal of the robotic instruments. The [employer] robot was then undocked from the patient and moved away from the bedside.  The gallbladder was removed and placed in an Endocatch bag. The liver bed was irrigated and inspected. Hemostasis was insured. Copious irrigation was utilized and was repeatedly aspirated until clear.  The gallbladder and Endocatch sac  were then removed through the  umbilical port site.   Inspection of the right upper quadrant was performed. No bleeding, bile duct injury or leak, or bowel injury was noted. Pneumoperitoneum was released.  Theumbilicalrt site was closed with figure-of-eight 0 Vicryl sutures. 4-0 subcuticular Monocryl was used to close the skin. Steristrips and Mastisol and sterile dressings were  applied.  The patient was then extubated and brought to the recovery room in stable condition. Sponge, lap, and needle counts were correct at closure and at the conclusion of the case.   Findings: Chronic cholecystitis   Estimated Blood Loss: 10 mm   Drains: none         Specimens: Gallbladder           Complications: none               Riyansh Gerstner T. Adonis Huguenin, MD, FACS

## 2015-10-10 NOTE — H&P (View-Only) (Signed)
Patient ID: Sabrina Mcgee, female   DOB: Nov 27, 1990, 25 y.o.   MRN: [id number]  CC: RUQ Abdominal pain  HPI Sabrina Mcgee is a 25 y.o. female presents to clinic for evaluation of a 2 month history of abdominal pain. Patient states over the last month she had an ultrasound which did not show any gallstones and then had a HIDA scan for evaluation of her abdominal pain. The HIDA scan did cause her symptoms to occur. She states that after eating she developed some right upper quadrant pain she had some subjective nausea about 3 weeks ago but no vomiting. The pain has come and gone and very spent on what she eats. She denies any fevers, chills, chest pain, shortness of breath, diarrhea, constipation. She is otherwise in her usual state of excellent health.  HPI  Past Medical History  Diagnosis Date  . Goiter   . Depression   . Anxiety   . Vitamin D deficiency disease   . Anemia     Past Surgical History  Procedure Laterality Date  . Tonsillectomy  2004    Family History  Problem Relation Age of Onset  . Heart disease Mother   . Hypertension Mother   . Cancer Father     kidney  . Heart disease Father   . Hypertension Father   . Cancer Paternal Aunt     breast  . Heart disease Maternal Grandmother   . Hypertension Maternal Grandmother   . Heart disease Maternal Grandfather   . Hypertension Maternal Grandfather   . Heart disease Paternal Grandmother   . Hypertension Paternal Grandmother   . COPD Paternal Grandmother   . Cancer Paternal Grandfather     prostate  . Heart disease Paternal Grandfather   . Hypertension Paternal Grandfather   . Diabetes Paternal Aunt     Social History Social History  Substance Use Topics  . Smoking status: Never Smoker   . Smokeless tobacco: Never Used  . Alcohol Use: No    No Known Allergies  Current Outpatient Prescriptions  Medication Sig Dispense Refill  . escitalopram (LEXAPRO) 10 MG tablet Take 1 tablet (10 mg total) by mouth  daily. 30 tablet 6  . Multiple Vitamin (MULTIVITAMIN) tablet Take 1 tablet by mouth daily.     No current facility-administered medications for this visit.     Review of Systems A multi-point review of systems was asked and was negative except for the findings are dictated in the history of present illness  Physical Exam Blood pressure 128/84, pulse 97, temperature 97.9 F (36.6 C), temperature source Oral, height 5\' 7"  (1.702 m), weight 83.915 kg (185 lb), last menstrual period 08/25/2015. CONSTITUTIONAL: No acute distress. EYES: Pupils are equal, round, and reactive to light, Sclera are non-icteric. EARS, NOSE, MOUTH AND THROAT: The oropharynx is clear. The oral mucosa is pink and moist. Hearing is intact to voice. LYMPH NODES:  Lymph nodes in the neck are normal. RESPIRATORY:  Lungs are clear. There is normal respiratory effort, with equal breath sounds bilaterally, and without pathologic use of accessory muscles. CARDIOVASCULAR: Heart is regular without murmurs, gallops, or rubs. GI: The abdomen is soft, nontender, and nondistended. There are no palpable masses. There is no hepatosplenomegaly. There are normal bowel sounds in all quadrants. GU: Rectal deferred.   MUSCULOSKELETAL: Normal muscle strength and tone. No cyanosis or edema.   SKIN: Turgor is good and there are no pathologic skin lesions or ulcers. NEUROLOGIC: Motor and sensation is grossly normal. Cranial  nerves are grossly intact. PSYCH:  Oriented to person, place and time. Affect is normal.  Data Reviewed I reviewed the patient's images and labs, labs are all within normal limits. Ultrasound does not show any evidence of biliary disease, HIDA scan shows a mildly diminished ejection fraction at 31% I have personally reviewed the patient's imaging, laboratory findings and medical records.    Assessment    Biliary dyskinesia    Plan    Discussed the diagnosis of biliary dyskinesia with the patient and her mother.  Discussed that it is not 100% correlation between biliary dyskinesia and abdominal pain however the fact that her HIDA scan reproduced her pain made it a probable cause of her postprandial pain. Patient and mother voiced understanding centigrade research this prior to coming to see me.I discussed the procedure of a laparoscopic versus robotic cholecystectomy in detail.  The patient was given Neurosurgeon.  We discussed the risks and benefits of a laparoscopic cholecystectomy and possible cholangiogram including, but not limited to bleeding, infection, injury to surrounding structures such as the intestine or liver, bile leak, retained gallstones, need to convert to an open procedure, prolonged diarrhea, blood clots such as  DVT, common bile duct injury, anesthesia risks, and possible need for additional procedures.  The likelihood of improvement in symptoms and return to the patient's normal status is good. We discussed the typical post-operative recovery course. Patient and mother voiced understanding and desire to proceed with a robotic assisted laparoscopic cholecystectomy. Plan for surgery on March 21.      Time spent with the patient was 55 minutes, with more than 50% of the time spent in face-to-face education, counseling and care coordination.     Clayburn Pert, MD FACS General Surgeon 09/18/2015, 2:58 PM

## 2015-10-10 NOTE — Anesthesia Preprocedure Evaluation (Signed)
Anesthesia Evaluation  Patient identified by MRN, date of birth, ID band Patient awake    Reviewed: Allergy & Precautions, NPO status , Patient's Chart, lab work & pertinent test results  Airway Mallampati: I       Dental no notable dental hx.    Pulmonary neg pulmonary ROS,    Pulmonary exam normal        Cardiovascular negative cardio ROS Normal cardiovascular exam     Neuro/Psych negative neurological ROS     GI/Hepatic negative GI ROS, Neg liver ROS,   Endo/Other  negative endocrine ROS  Renal/GU negative Renal ROS     Musculoskeletal negative musculoskeletal ROS (+)   Abdominal Normal abdominal exam  (+)   Peds negative pediatric ROS (+)  Hematology negative hematology ROS (+)   Anesthesia Other Findings   Reproductive/Obstetrics                             Anesthesia Physical Anesthesia Plan  ASA: I  Anesthesia Plan: General   Post-op Pain Management:    Induction: Intravenous  Airway Management Planned: Oral ETT  Additional Equipment:   Intra-op Plan:   Post-operative Plan:   Informed Consent: I have reviewed the patients History and Physical, chart, labs and discussed the procedure including the risks, benefits and alternatives for the proposed anesthesia with the patient or authorized representative who has indicated his/her understanding and acceptance.     Plan Discussed with: CRNA  Anesthesia Plan Comments:         Anesthesia Quick Evaluation

## 2015-10-11 ENCOUNTER — Encounter: Payer: Self-pay | Admitting: General Surgery

## 2015-10-11 NOTE — Anesthesia Postprocedure Evaluation (Signed)
Anesthesia Post Note  Patient: Sabrina Mcgee  Procedure(s) Performed: Procedure(s) (LRB): ROBOTIC ASSISTED LAPAROSCOPIC CHOLECYSTECTOMY (N/A)  Patient location during evaluation: PACU Anesthesia Type: General Level of consciousness: awake Pain management: pain level controlled Vital Signs Assessment: post-procedure vital signs reviewed and stable Respiratory status: spontaneous breathing Cardiovascular status: blood pressure returned to baseline Anesthetic complications: no    Last Vitals:  Filed Vitals:   10/10/15 1553 10/10/15 1602  BP: 119/69 108/66  Pulse: 85 86  Temp: 36.2 C 36.2 C  Resp: 16 16    Last Pain:  Filed Vitals:   10/11/15 0827  PainSc: 1                  VAN STAVEREN,Kendryck Lacroix

## 2015-10-12 LAB — SURGICAL PATHOLOGY

## 2015-10-26 ENCOUNTER — Encounter: Payer: Self-pay | Admitting: General Surgery

## 2015-10-26 ENCOUNTER — Ambulatory Visit (INDEPENDENT_AMBULATORY_CARE_PROVIDER_SITE_OTHER): Payer: BLUE CROSS/BLUE SHIELD | Admitting: General Surgery

## 2015-10-26 VITALS — BP 124/75 | HR 88 | Temp 98.0°F | Wt 184.0 lb

## 2015-10-26 DIAGNOSIS — Z4889 Encounter for other specified surgical aftercare: Secondary | ICD-10-CM

## 2015-10-26 NOTE — Patient Instructions (Addendum)
Try taking Dulcolax 5 MG 1 tab daily to help with your constipation.  Please call our office with any questions or concerns.  Please do not submerge in a tub, hot tub, or pool until incisions are completely sealed.  Use sun block to incision area over the next year if this area will be exposed to sun. This helps decrease scarring.  You may now resume your normal activities. Listen to your body when lifting, if you have pain when lifting, stop and then try again in a few days.  If you develop redness, drainage, or pain at incision sites- call our office immediately and speak with a nurse.

## 2015-10-26 NOTE — Progress Notes (Signed)
Outpatient Surgical Follow Up  10/26/2015  Sabrina Mcgee is an 25 y.o. female.   Chief Complaint  Patient presents with  . Routine Post Op    Laparoscopic Cholecystectomy 10/10/2015 Dr. Adonis Huguenin    HPI: 25 year old female returns to clinic 2 weeks status post robotic assisted laparoscopic cholecystectomy. Patient reports doing well. Denies any pain. She is eating well without any nausea, vomiting, diarrhea, constipation. She states she does have occasional hard stools in the stomach for this. She's been very happy with her surgical results and has already return to work.  Past Medical History  Diagnosis Date  . Goiter   . Depression   . Anxiety   . Vitamin D deficiency disease   . Anemia   . Family history of adverse reaction to anesthesia     moher nausea and vomiting    Past Surgical History  Procedure Laterality Date  . Tonsillectomy  2004  . Robotic assisted laparoscopic cholecystectomy N/A 10/10/2015    Procedure: ROBOTIC ASSISTED LAPAROSCOPIC CHOLECYSTECTOMY;  Surgeon: Clayburn Pert, MD;  Location: [facility] ORS;  Service: General;  Laterality: N/A;    Family History  Problem Relation Age of Onset  . Heart disease Mother   . Hypertension Mother   . Cancer Father     kidney  . Heart disease Father   . Hypertension Father   . Cancer Paternal Aunt     breast  . Heart disease Maternal Grandmother   . Hypertension Maternal Grandmother   . Heart disease Maternal Grandfather   . Hypertension Maternal Grandfather   . Heart disease Paternal Grandmother   . Hypertension Paternal Grandmother   . COPD Paternal Grandmother   . Cancer Paternal Grandfather     prostate  . Heart disease Paternal Grandfather   . Hypertension Paternal Grandfather   . Diabetes Paternal Aunt     Social History:  reports that she has never smoked. She has never used smokeless tobacco. She reports that she does not drink alcohol or use illicit drugs.  Allergies: No Known Allergies  Medications  reviewed.    ROS A multipoint review of systems was completed, all pertinent positives and negatives are documented in the history of present illness and remainder are negative   BP 124/75 mmHg  Pulse 88  Temp(Src) 98 F (36.7 C) (Oral)  Wt 83.462 kg (184 lb)  LMP 10/23/2015  Physical Exam  Out: No acute distress Chest: Clear to auscultation Heart: Regular rhythm Abdomen: Soft, nontender, nondistended. Well approximate laparoscopic incision sites without evidence of infection or drainage.   No results found for this or any previous visit (from the past 48 hour(s)). No results found.  Assessment/Plan:  1. Aftercare following surgery 25 year old female status post laparoscopic cholecystectomy. Pathology reviewed with patient which shows chronic cholecystitis. Patient doing well. Discussed signs and symptoms of infection or herniation and returning to clinic immediately should they occur. Also provided with standard postoperative wound care/incision care instructions. She voiced understanding. Otherwise she'll follow-up in clinic on an as-needed basis.     Clayburn Pert, MD FACS General Surgeon  10/26/2015,11:23 AM

## 2015-11-15 ENCOUNTER — Ambulatory Visit: Payer: BLUE CROSS/BLUE SHIELD | Admitting: Family Medicine

## 2015-11-21 ENCOUNTER — Ambulatory Visit (INDEPENDENT_AMBULATORY_CARE_PROVIDER_SITE_OTHER): Payer: BLUE CROSS/BLUE SHIELD | Admitting: Family Medicine

## 2015-11-21 ENCOUNTER — Encounter: Payer: Self-pay | Admitting: Family Medicine

## 2015-11-21 VITALS — BP 116/62 | HR 89 | Temp 98.6°F | Resp 14 | Wt 185.5 lb

## 2015-11-21 DIAGNOSIS — F4010 Social phobia, unspecified: Secondary | ICD-10-CM

## 2015-11-21 MED ORDER — ESCITALOPRAM OXALATE 10 MG PO TABS: 10.0000 mg | ORAL_TABLET | Freq: Every evening | ORAL | Status: DC

## 2015-11-21 NOTE — Patient Instructions (Signed)
Return in one year, but call sooner if needed

## 2015-11-21 NOTE — Progress Notes (Signed)
   BP 116/62 mmHg  Pulse 89  Temp(Src) 98.6 F (37 C) (Oral)  Resp 14  Wt 185 lb 8 oz (84.142 kg)  SpO2 99%  LMP 11/18/2015 (Approximate)   Subjective:    Patient ID: Sabrina Mcgee, female    DOB: 21-Aug-1990, 25 y.o.   MRN: [id number]  HPI: Sabrina Mcgee is a 25 y.o. female  Chief Complaint  Patient presents with  . Follow-up    6 month   No fevers; appetite is good; no nausea; had choly and doing great Taking escitalopram; no headaches, no problems with sleep; taking at night; would like refills today  Depression screen [facility] 2/9 11/21/2015 05/15/2015 04/17/2015 02/27/2015  Decreased Interest 0 0 2 2  Down, Depressed, Hopeless 0 0 2 1  PHQ - 2 Score 0 0 4 3  Altered sleeping - - 3 3  Tired, decreased energy - - 3 3  Change in appetite - - 1 1  Feeling bad or failure about yourself  - - 2 3  Trouble concentrating - - 2 0  Moving slowly or fidgety/restless - - 0 0  Suicidal thoughts - - 0 0  PHQ-9 Score - - 15 13  Difficult doing work/chores - - - Somewhat difficult    Relevant past medical, surgical, family and social history reviewed Past Medical History  Diagnosis Date  . Goiter   . Depression   . Anxiety   . Vitamin D deficiency disease   . Anemia   . Family history of adverse reaction to anesthesia     moher nausea and vomiting   Past Surgical History  Procedure Laterality Date  . Tonsillectomy  2004  . Robotic assisted laparoscopic cholecystectomy N/A 10/10/2015    Procedure: ROBOTIC ASSISTED LAPAROSCOPIC CHOLECYSTECTOMY;  Surgeon: Clayburn Pert, MD;  Location: [facility] ORS;  Service: General;  Laterality: N/A;   Interim medical history since last visit reviewed. Allergies and medications reviewed  Review of Systems Per HPI unless specifically indicated above     Objective:    BP 116/62 mmHg  Pulse 89  Temp(Src) 98.6 F (37 C) (Oral)  Resp 14  Wt 185 lb 8 oz (84.142 kg)  SpO2 99%  LMP 11/18/2015 (Approximate)  Wt Readings from Last 3 Encounters:    11/21/15 185 lb 8 oz (84.142 kg)  10/26/15 184 lb (83.462 kg)  10/10/15 180 lb (81.647 kg)    Physical Exam  Constitutional: She appears well-developed and well-nourished.  Psychiatric: She has a normal mood and affect.      Assessment & Plan:   Problem List Items Addressed This Visit      Other   Social anxiety disorder - Primary    Continue SSRI         Follow up plan: Return in about 1 year (around 11/20/2016) for follow-up.  An after-visit summary was printed and given to the patient at [place].  Please see the patient instructions which may contain other information and recommendations beyond what is mentioned above in the assessment and plan.  Meds ordered this encounter  Medications  . escitalopram (LEXAPRO) 10 MG tablet    Sig: Take 1 tablet (10 mg total) by mouth every evening.    Dispense:  30 tablet    Refill:  11   Patient had a very lengthy wait time today; visit was very brief

## 2015-11-26 NOTE — Assessment & Plan Note (Signed)
- 

## 2016-01-16 ENCOUNTER — Encounter: Payer: Self-pay | Admitting: Family Medicine

## 2016-03-22 ENCOUNTER — Encounter: Payer: Self-pay | Admitting: Family Medicine

## 2016-03-22 ENCOUNTER — Ambulatory Visit (INDEPENDENT_AMBULATORY_CARE_PROVIDER_SITE_OTHER): Payer: Managed Care, Other (non HMO) | Admitting: Family Medicine

## 2016-03-22 DIAGNOSIS — R110 Nausea: Secondary | ICD-10-CM | POA: Diagnosis not present

## 2016-03-22 DIAGNOSIS — R51 Headache: Secondary | ICD-10-CM | POA: Diagnosis not present

## 2016-03-22 DIAGNOSIS — R519 Headache, unspecified: Secondary | ICD-10-CM | POA: Insufficient documentation

## 2016-03-22 MED ORDER — BUTALBITAL-APAP-CAFFEINE 50-325-40 MG PO TABS: 1.0000 | ORAL_TABLET | Freq: Four times a day (QID) | ORAL | 0 refills | Status: DC | PRN

## 2016-03-22 NOTE — Assessment & Plan Note (Signed)
Check head CT

## 2016-03-22 NOTE — Assessment & Plan Note (Addendum)
Given the chronicity and associated nausea, will start with head CT and refer to neurologist; reasons to go to ER reviewed; limited Rx of butalbital-caffeine-apap given

## 2016-03-22 NOTE — Patient Instructions (Signed)
We'll get a head CT and have you see the neurologist Try the new medicine for pain if needed Go to the ER over the long weekend if needed If you have not heard anything from my staff in a week about any orders/referrals/studies from today, please contact us here to follow-up [phone]

## 2016-03-22 NOTE — Progress Notes (Signed)
BP 116/78   Pulse 100   Temp 98.4 F (36.9 C) (Oral)   Resp 14   Wt 193 lb (87.5 kg)   LMP 03/10/2016   SpO2 97%   BMI 30.23 kg/m    Subjective:    Patient ID: Sabrina Mcgee, female    DOB: 1991-04-14, 25 y.o.   MRN: [id number]  HPI: Sabrina Mcgee is a 25 y.o. female  Chief Complaint  Patient presents with  . Migraine   Patient is here for evaluation of headaches More migraines; having headaches every day; the same headache all the time Lessened in severity when she quit taking multiple vitamins Severity is a 6 out a 10 Headache is like a pressure or fullness, little bit of throbbing Located all over, sometimes in the front Not sinus related No change in hearing or vision, or sense of taste or smell She has not been sleeping well No fam hx of migraines Not tied to periods Does wake up with them Does have some nausea with these Going on since June No trouble speaking or swallowing; no tingling in arms or legs Balance is okay Has tried aleve and tylenol; not really helping  Depression screen [facility] 2/9 03/22/2016 11/21/2015 05/15/2015 04/17/2015 02/27/2015  Decreased Interest 0 0 0 2 2  Down, Depressed, Hopeless 0 0 0 2 1  PHQ - 2 Score 0 0 0 4 3  Altered sleeping - - - 3 3  Tired, decreased energy - - - 3 3  Change in appetite - - - 1 1  Feeling bad or failure about yourself  - - - 2 3  Trouble concentrating - - - 2 0  Moving slowly or fidgety/restless - - - 0 0  Suicidal thoughts - - - 0 0  PHQ-9 Score - - - 15 13  Difficult doing work/chores - - - - Somewhat difficult    Relevant past medical, surgical, family and social history reviewed Past Medical History:  Diagnosis Date  . Anemia   . Anxiety   . Depression   . Family history of adverse reaction to anesthesia    moher nausea and vomiting  . Goiter   . Vitamin D deficiency disease    Past Surgical History:  Procedure Laterality Date  . ROBOTIC ASSISTED LAPAROSCOPIC CHOLECYSTECTOMY N/A 10/10/2015   Procedure: ROBOTIC ASSISTED LAPAROSCOPIC CHOLECYSTECTOMY;  Surgeon: Clayburn Pert, MD;  Location: [facility] ORS;  Service: General;  Laterality: N/A;  . TONSILLECTOMY  2004   Family History  Problem Relation Age of Onset  . Heart disease Mother   . Hypertension Mother   . Cancer Father     kidney  . Heart disease Father   . Hypertension Father   . Cancer Paternal Aunt     breast  . Heart disease Maternal Grandmother   . Hypertension Maternal Grandmother   . Heart disease Maternal Grandfather   . Hypertension Maternal Grandfather   . Heart disease Paternal Grandmother   . Hypertension Paternal Grandmother   . COPD Paternal Grandmother   . Cancer Paternal Grandfather     prostate  . Heart disease Paternal Grandfather   . Hypertension Paternal Grandfather   . Diabetes Paternal Aunt    Social History  Substance Use Topics  . Smoking status: Never Smoker  . Smokeless tobacco: Never Used  . Alcohol use No   Interim medical history since last visit reviewed. Allergies and medications reviewed  Review of Systems Per HPI unless specifically indicated above  Objective:    BP 116/78   Pulse 100   Temp 98.4 F (36.9 C) (Oral)   Resp 14   Wt 193 lb (87.5 kg)   LMP 03/10/2016   SpO2 97%   BMI 30.23 kg/m   Wt Readings from Last 3 Encounters:  03/22/16 193 lb (87.5 kg)  11/21/15 185 lb 8 oz (84.1 kg)  10/26/15 184 lb (83.5 kg)    Physical Exam  Constitutional: She appears well-developed and well-nourished. No distress.  HENT:  Head: Normocephalic and atraumatic.  Eyes: EOM are normal. Pupils are equal, round, and reactive to light. No scleral icterus.  Cardiovascular: Normal rate, regular rhythm and normal heart sounds.   No murmur heard. Pulmonary/Chest: Effort normal and breath sounds normal.  Neurological: She is alert. She displays no atrophy and no tremor. No cranial nerve deficit or sensory deficit. She exhibits normal muscle tone. Coordination and gait normal.    Very slight waver with Romberg, no pronator drift; finger-to-nose intact, finger-to-nose-to-finger intact, no dysdiadochokinesis, normal gait, normal heel to toe walk; UE and LE strength 5/5  Skin: Skin is warm and dry. She is not diaphoretic. No pallor.  Psychiatric: She has a normal mood and affect.      Assessment & Plan:   Problem List Items Addressed This Visit      Other   Nausea without vomiting    Check head CT      Relevant Orders   CT Head Wo Contrast   Ambulatory referral to Neurology   Headache, chronic daily    Given the chronicity and associated nausea, will start with head CT and refer to neurologist; reasons to go to ER reviewed; limited Rx of butalbital-caffeine-apap given      Relevant Medications   butalbital-acetaminophen-caffeine (ESGIC) 50-325-40 MG tablet   Other Relevant Orders   CT Head Wo Contrast   Ambulatory referral to Neurology    Other Visit Diagnoses   None.     Follow up plan: No Follow-up on file.  An after-visit summary was printed and given to the patient at [place].  Please see the patient instructions which may contain other information and recommendations beyond what is mentioned above in the assessment and plan.  Meds ordered this encounter  Medications  . butalbital-acetaminophen-caffeine (ESGIC) 50-325-40 MG tablet    Sig: Take 1 tablet by mouth every 6 (six) hours as needed for headache.    Dispense:  20 tablet    Refill:  0    Orders Placed This Encounter  Procedures  . CT Head Wo Contrast  . Ambulatory referral to Neurology

## 2016-04-02 ENCOUNTER — Ambulatory Visit: Payer: Managed Care, Other (non HMO)

## 2016-06-17 ENCOUNTER — Telehealth: Payer: Self-pay

## 2016-06-17 NOTE — Telephone Encounter (Signed)
She needs to be evaluated, here or urgent care today; wherever there is an opening

## 2016-06-17 NOTE — Telephone Encounter (Signed)
Tried calling pt regarding her sinus infection. Pt has a voicemail but not able to leave messages.

## 2016-06-17 NOTE — Telephone Encounter (Signed)
Pt says she has a sinus infection for about 2 weeks now and she can't swallow anymore because her throat hurts so much.  What do you recommend?

## 2016-09-05 ENCOUNTER — Ambulatory Visit (INDEPENDENT_AMBULATORY_CARE_PROVIDER_SITE_OTHER): Payer: Managed Care, Other (non HMO) | Admitting: Family Medicine

## 2016-09-05 ENCOUNTER — Encounter: Payer: Self-pay | Admitting: Family Medicine

## 2016-09-05 VITALS — BP 114/74 | HR 97 | Temp 98.8°F | Resp 16 | Wt 200.4 lb

## 2016-09-05 DIAGNOSIS — F330 Major depressive disorder, recurrent, mild: Secondary | ICD-10-CM

## 2016-09-05 DIAGNOSIS — Z23 Encounter for immunization: Secondary | ICD-10-CM

## 2016-09-05 DIAGNOSIS — F4010 Social phobia, unspecified: Secondary | ICD-10-CM

## 2016-09-05 MED ORDER — ESCITALOPRAM OXALATE 10 MG PO TABS: 15.0000 mg | ORAL_TABLET | Freq: Every evening | ORAL | 0 refills | Status: DC

## 2016-09-05 NOTE — Patient Instructions (Addendum)
Increase the escitalopram from 10 mg daily to 15 mg daily (that's one and a half pills of the 10 mg strength) Call me with an update in 3-4 weeks, sooner if needed You received the flu shot today; it should protect you against the flu virus over the coming months; it will take about two weeks for antibodies to develop; do try to stay away from hospitals, nursing homes, and daycares during peak flu season; taking extra vitamin C daily during flu season may help you avoid getting sick

## 2016-09-05 NOTE — Progress Notes (Signed)
BP 114/74   Pulse 97   Temp 98.8 F (37.1 C) (Oral)   Resp 16   Wt 200 lb 6 oz (90.9 kg)   LMP 08/21/2016   SpO2 96%   BMI 31.38 kg/m    Subjective:    Patient ID: Sabrina Mcgee, female    DOB: 01/10/1991, 26 y.o.   MRN: [id number]  HPI: Sabrina Mcgee is a 26 y.o. female  Chief Complaint  Patient presents with  . Follow-up    Discuss escitalopram dosaged    She is feeling more irritated, not as interested in things as much, trouble sleeping Trouble falling and staying asleep No tearfulness, no anger; feeling anxious; nothing in particular Some finger picking and nail biting Some changes in appetite too She would like to consider increasing the medicine No problems on the medicine No anxiety in the family, but some OCD No excess stress; no abuse  Depression screen [facility] 2/9 09/05/2016 03/22/2016 11/21/2015 05/15/2015 04/17/2015  Decreased Interest 1 0 0 0 2  Down, Depressed, Hopeless 1 0 0 0 2  PHQ - 2 Score 2 0 0 0 4  Altered sleeping 1 - - - 3  Tired, decreased energy 1 - - - 3  Change in appetite 1 - - - 1  Feeling bad or failure about yourself  1 - - - 2  Trouble concentrating 0 - - - 2  Moving slowly or fidgety/restless 0 - - - 0  Suicidal thoughts 0 - - - 0  PHQ-9 Score 6 - - - 15  Difficult doing work/chores Not difficult at all - - - -  MD notes: no SI, HI; she promises to seek help if ever develops  Relevant past medical, surgical, family and social history reviewed Past Medical History:  Diagnosis Date  . Anemia   . Anxiety   . Depression   . Family history of adverse reaction to anesthesia    moher nausea and vomiting  . Goiter   . Vitamin D deficiency disease    Past Surgical History:  Procedure Laterality Date  . ROBOTIC ASSISTED LAPAROSCOPIC CHOLECYSTECTOMY N/A 10/10/2015   Procedure: ROBOTIC ASSISTED LAPAROSCOPIC CHOLECYSTECTOMY;  Surgeon: Clayburn Pert, MD;  Location: [facility] ORS;  Service: General;  Laterality: N/A;  . TONSILLECTOMY  2004    Social History  Substance Use Topics  . Smoking status: Never Smoker  . Smokeless tobacco: Never Used  . Alcohol use No   Interim medical history since last visit reviewed. Allergies and medications reviewed  Review of Systems Per HPI unless specifically indicated above     Objective:    BP 114/74   Pulse 97   Temp 98.8 F (37.1 C) (Oral)   Resp 16   Wt 200 lb 6 oz (90.9 kg)   LMP 08/21/2016   SpO2 96%   BMI 31.38 kg/m   Wt Readings from Last 3 Encounters:  09/05/16 200 lb 6 oz (90.9 kg)  03/22/16 193 lb (87.5 kg)  11/21/15 185 lb 8 oz (84.1 kg)    Physical Exam  Constitutional: She appears well-developed and well-nourished.  HENT:  Mouth/Throat: Mucous membranes are normal.  Eyes: EOM are normal. No scleral icterus.  Cardiovascular: Normal rate and regular rhythm.   Pulmonary/Chest: Effort normal and breath sounds normal.  Psychiatric: Her behavior is normal. Thought content normal. Her mood appears anxious. Her affect is not blunt and not inappropriate. Her speech is not rapid and/or pressured and not delayed. She is not agitated  and not slowed. She does not exhibit a depressed mood. She expresses no homicidal and no suicidal ideation.       Assessment & Plan:   Problem List Items Addressed This Visit      Other   Social anxiety disorder - Primary    Agree with increasing the SSRI; will try 15 mg, but if insurance won't cover the half pill addition, will have to increase to 20 mg daily; patient was asked to call me in 3-4 weeks with an update, sooner if needed      Depression    Increase the SSRI, call with an update; always seek medical help if any thoughts of SI/HI; she agrees      Relevant Medications   escitalopram (LEXAPRO) 10 MG tablet    Other Visit Diagnoses    Needs flu shot       Relevant Orders   Flu Vaccine QUAD 36+ mos PF IM (Fluarix & Fluzone Quad PF) (Completed)       Follow up plan: No Follow-up on file.  An after-visit summary  was printed and given to the patient at [place].  Please see the patient instructions which may contain other information and recommendations beyond what is mentioned above in the assessment and plan.  Meds ordered this encounter  Medications  . escitalopram (LEXAPRO) 10 MG tablet    Sig: Take 1.5 tablets (15 mg total) by mouth every evening.    Dispense:  45 tablet    Refill:  0    Changing the dose    Orders Placed This Encounter  Procedures  . Flu Vaccine QUAD 36+ mos PF IM (Fluarix & Fluzone Quad PF)

## 2016-09-09 NOTE — Assessment & Plan Note (Signed)
Agree with increasing the SSRI; will try 15 mg, but if insurance won't cover the half pill addition, will have to increase to 20 mg daily; patient was asked to call me in 3-4 weeks with an update, sooner if needed

## 2016-09-09 NOTE — Assessment & Plan Note (Signed)
Increase the SSRI, call with an update; always seek medical help if any thoughts of SI/HI; she agrees

## 2016-10-28 ENCOUNTER — Other Ambulatory Visit: Payer: Self-pay | Admitting: Family Medicine

## 2016-12-27 ENCOUNTER — Ambulatory Visit: Payer: Managed Care, Other (non HMO) | Admitting: Family Medicine

## 2017-01-01 ENCOUNTER — Encounter: Payer: Self-pay | Admitting: Family Medicine

## 2017-01-01 ENCOUNTER — Ambulatory Visit (INDEPENDENT_AMBULATORY_CARE_PROVIDER_SITE_OTHER): Payer: Managed Care, Other (non HMO) | Admitting: Family Medicine

## 2017-01-01 VITALS — BP 110/68 | HR 94 | Temp 98.6°F | Resp 16 | Ht 65.8 in | Wt 216.0 lb

## 2017-01-01 DIAGNOSIS — R635 Abnormal weight gain: Secondary | ICD-10-CM

## 2017-01-01 DIAGNOSIS — E669 Obesity, unspecified: Secondary | ICD-10-CM | POA: Insufficient documentation

## 2017-01-01 DIAGNOSIS — Z114 Encounter for screening for human immunodeficiency virus [HIV]: Secondary | ICD-10-CM

## 2017-01-01 DIAGNOSIS — E559 Vitamin D deficiency, unspecified: Secondary | ICD-10-CM | POA: Diagnosis not present

## 2017-01-01 DIAGNOSIS — E6609 Other obesity due to excess calories: Secondary | ICD-10-CM

## 2017-01-01 DIAGNOSIS — Z113 Encounter for screening for infections with a predominantly sexual mode of transmission: Secondary | ICD-10-CM

## 2017-01-01 DIAGNOSIS — E860 Dehydration: Secondary | ICD-10-CM

## 2017-01-01 DIAGNOSIS — Z6833 Body mass index (BMI) 33.0-33.9, adult: Secondary | ICD-10-CM

## 2017-01-01 LAB — BASIC METABOLIC PANEL
BUN: 12 mg/dL (ref 7–25)
CO2: 23 mmol/L (ref 20–31)
Calcium: 9.1 mg/dL (ref 8.6–10.2)
Chloride: 107 mmol/L (ref 98–110)
Creat: 0.63 mg/dL (ref 0.50–1.10)
Glucose, Bld: 90 mg/dL (ref 65–99)
Potassium: 3.7 mmol/L (ref 3.5–5.3)
Sodium: 140 mmol/L (ref 135–146)

## 2017-01-01 LAB — CBC WITH DIFFERENTIAL/PLATELET
Basophils Absolute: 0 cells/uL (ref 0–200)
Basophils Relative: 0 %
Eosinophils Absolute: 0 cells/uL — ABNORMAL LOW (ref 15–500)
Eosinophils Relative: 0 %
HCT: 32.5 % — ABNORMAL LOW (ref 35.0–45.0)
Hemoglobin: 10.5 g/dL — ABNORMAL LOW (ref 11.7–15.5)
Lymphocytes Relative: 35 %
Lymphs Abs: 1610 cells/uL (ref 850–3900)
MCH: 24.5 pg — ABNORMAL LOW (ref 27.0–33.0)
MCHC: 32.3 g/dL (ref 32.0–36.0)
MCV: 75.9 fL — ABNORMAL LOW (ref 80.0–100.0)
Monocytes Absolute: 368 cells/uL (ref 200–950)
Monocytes Relative: 8 %
Neutro Abs: 2622 cells/uL (ref 1500–7800)
Neutrophils Relative %: 57 %
Platelets: 192 10*3/uL (ref 140–400)
RBC: 4.28 MIL/uL (ref 3.80–5.10)
RDW: 17.1 % — ABNORMAL HIGH (ref 11.0–15.0)
WBC: 4.6 10*3/uL (ref 3.8–10.8)

## 2017-01-01 MED ORDER — LIRAGLUTIDE -WEIGHT MANAGEMENT 18 MG/3ML ~~LOC~~ SOPN: 0.6000 mg | PEN_INJECTOR | Freq: Every day | SUBCUTANEOUS | 0 refills | Status: DC

## 2017-01-01 MED ORDER — PEN NEEDLES 31G X 6 MM MISC: 1 refills | Status: DC

## 2017-01-01 NOTE — Progress Notes (Signed)
BP 110/68   Pulse 94   Temp 98.6 F (37 C) (Oral)   Resp 16   Ht 5' 5.8" (1.671 m)   Wt 216 lb (98 kg)   LMP 12/20/2016   SpO2 98%   BMI 35.08 kg/m    Subjective:    Patient ID: Sabrina Mcgee, female    DOB: Jun 21, 1991, 26 y.o.   MRN: [id number]  HPI: Sabrina Mcgee is a 26 y.o. female  Chief Complaint  Patient presents with  . Weight Loss    Have trouble lossing    HPI She is here for a visit She is having trouble losing weight This is her peak weight today She thinks 160 pounds would be a good weight She has been trying to lose weight She was doing Donetta P[place], [place] self-defense exercise program; did that for 6 months She did not lose any weight She changed her diet Tried five diets, tried the [employer], vegan, juice cleanse, [employer], and gallbladder diet Was vegan for six months May not be drinking enough water Does drink juice; cranberry juice; not sure how many calories Eating some in the car She has tried on her own and would like to discuss medicine Usually goes 1.5 miles on the slow days and 4-6 days on working day No med in particular No constipation; no fatigue No personal hx of thyroid cancer; great grandmother had pancreatitic cancer, no MEN-2 She works outside; stands outside and directs traffic; got sick; has heat intolerance; she admits this might not be the best the job with her heat intolerance; does have a chance to hydrate; no particular temperature or humidity is the problem; would like something to say that she can rest, hydrate, get indoors or get inside car and use [facility]  Depression screen [facility] 2/9 01/01/2017 09/05/2016 03/22/2016 11/21/2015 05/15/2015  Decreased Interest 1 1 0 0 0  Down, Depressed, Hopeless 1 1 0 0 0  PHQ - 2 Score 2 2 0 0 0  Altered sleeping 1 1 - - -  Tired, decreased energy 1 1 - - -  Change in appetite 1 1 - - -  Feeling bad or failure about yourself  1 1 - - -  Trouble concentrating 0 0 - - -  Moving slowly or  fidgety/restless 1 0 - - -  Suicidal thoughts 0 0 - - -  PHQ-9 Score 7 6 - - -  Difficult doing work/chores Not difficult at all Not difficult at all - - -    Relevant past medical, surgical, family and social history reviewed Past Medical History:  Diagnosis Date  . Anemia   . Anxiety   . Depression   . Family history of adverse reaction to anesthesia    moher nausea and vomiting  . Goiter   . Vitamin D deficiency disease    Past Surgical History:  Procedure Laterality Date  . ROBOTIC ASSISTED LAPAROSCOPIC CHOLECYSTECTOMY N/A 10/10/2015   Procedure: ROBOTIC ASSISTED LAPAROSCOPIC CHOLECYSTECTOMY;  Surgeon: Clayburn Pert, MD;  Location: [facility] ORS;  Service: General;  Laterality: N/A;  . TONSILLECTOMY  2004   Family History  Problem Relation Age of Onset  . Heart disease Mother   . Hypertension Mother   . Cancer Father        kidney  . Heart disease Father   . Hypertension Father   . Cancer Paternal Aunt        breast  . Heart disease Maternal Grandmother   . Hypertension  Maternal Grandmother   . Heart disease Maternal Grandfather   . Hypertension Maternal Grandfather   . Heart disease Paternal Grandmother   . Hypertension Paternal Grandmother   . COPD Paternal Grandmother   . Cancer Paternal Grandfather        prostate  . Heart disease Paternal Grandfather   . Hypertension Paternal Grandfather   . Diabetes Paternal Aunt    Social History   Social History  . Marital status: Single    Spouse name: N/A  . Number of children: N/A  . Years of education: N/A   Occupational History  . Not on file.   Social History Main Topics  . Smoking status: Never Smoker  . Smokeless tobacco: Never Used  . Alcohol use No  . Drug use: No  . Sexual activity: Yes    Birth control/ protection: None   Other Topics Concern  . Not on file   Social History Narrative  . No narrative on file    Interim medical history since last visit reviewed. Allergies and medications  reviewed  Review of Systems Per HPI unless specifically indicated above     Objective:    BP 110/68   Pulse 94   Temp 98.6 F (37 C) (Oral)   Resp 16   Ht 5' 5.8" (1.671 m)   Wt 216 lb (98 kg)   LMP 12/20/2016   SpO2 98%   BMI 35.08 kg/m   Wt Readings from Last 3 Encounters:  01/01/17 216 lb (98 kg)  09/05/16 200 lb 6 oz (90.9 kg)  03/22/16 193 lb (87.5 kg)    Physical Exam  Constitutional: She appears well-developed and well-nourished.  Obese; weight gain of 15+ pounds over the last 4 months  HENT:  Mouth/Throat: Mucous membranes are normal.  Eyes: EOM are normal. No scleral icterus.  Cardiovascular: Normal rate and regular rhythm.   Pulmonary/Chest: Effort normal and breath sounds normal.  Psychiatric: She has a normal mood and affect. Her behavior is normal.      Assessment & Plan:   Problem List Items Addressed This Visit      Other   Vitamin D deficiency    Check level and supplement if needed      Relevant Orders   VITAMIN D 25 Hydroxy (Vit-D Deficiency, Fractures) (Completed)   Class 1 obesity due to excess calories without serious comorbidity with body mass index (BMI) of 33.0 to 33.9 in adult - Primary    discused options Start saxenda      Relevant Medications   Liraglutide -Weight Management (SAXENDA) 18 MG/3ML SOPN    Other Visit Diagnoses    Screening for HIV (human immunodeficiency virus)       Relevant Orders   HIV antibody (with reflex) (Completed)   Screening for STD (sexually transmitted disease)       Relevant Orders   GC/Chlamydia Probe Amp (Completed)   Weight gain       check TSH in case this has led to her weight gain   Relevant Orders   TSH (Completed)   Dehydration       Relevant Orders   Basic Metabolic Panel (BMET) (Completed)   CBC with Differential/Platelet (Completed)      Face-to-face time with patient was more than 25 minutes, >50% time spent counseling and coordination of care  Follow up plan: No Follow-up on  file.  An after-visit summary was printed and given to the patient at [place].  Please see the patient instructions which  may contain other information and recommendations beyond what is mentioned above in the assessment and plan.  Meds ordered this encounter  Medications  . Insulin Pen Needle (PEN NEEDLES) 31G X 6 MM MISC    Sig: Use daily with Saxenda pen    Dispense:  90 each    Refill:  1  . Liraglutide -Weight Management (SAXENDA) 18 MG/3ML SOPN    Sig: Inject 0.6 mg into the skin daily. x 1 week, then 1.2 mg daily x 1 week, then 1.8 mg daily x 1 week, then 2.4 mg daily x 1 week, then 3 mg daily    Dispense:  3 mL    Refill:  0    Orders Placed This Encounter  Procedures  . GC/Chlamydia Probe Amp  . HIV antibody (with reflex)  . VITAMIN D 25 Hydroxy (Vit-D Deficiency, Fractures)  . TSH  . Basic Metabolic Panel (BMET)  . CBC with Differential/Platelet

## 2017-01-01 NOTE — Assessment & Plan Note (Signed)
Check level and supplement if needed 

## 2017-01-01 NOTE — Assessment & Plan Note (Signed)
discused options Start saxenda

## 2017-01-01 NOTE — Patient Instructions (Signed)
Check out the information at familydoctor.org entitled "Nutrition for Weight Loss: What You Need to Know about Fad Diets" Try to lose between 1-2 pounds per week by taking in fewer calories and burning off more calories You can succeed by limiting portions, limiting foods dense in calories and fat, becoming more active, and drinking 8 glasses of water a day (64 ounces) Don't skip meals, especially breakfast, as skipping meals may alter your metabolism Do not use over-the-counter weight loss pills or gimmicks that claim rapid weight loss A healthy BMI (or body mass index) is between 18.5 and 24.9 You can calculate your ideal BMI at the NIH website http://www.nhlbi.nih.gov/health/educational/lose_wt/BMI/bmicalc.htm  

## 2017-01-02 LAB — GC/CHLAMYDIA PROBE AMP
CT Probe RNA: NOT DETECTED
GC Probe RNA: NOT DETECTED

## 2017-01-02 LAB — HIV ANTIBODY (ROUTINE TESTING W REFLEX): HIV 1&2 Ab, 4th Generation: NONREACTIVE

## 2017-01-02 LAB — VITAMIN D 25 HYDROXY (VIT D DEFICIENCY, FRACTURES): Vit D, 25-Hydroxy: 21 ng/mL — ABNORMAL LOW (ref 30–100)

## 2017-01-02 LAB — TSH: TSH: 1.18 mIU/L

## 2017-01-06 ENCOUNTER — Encounter: Payer: Self-pay | Admitting: Family Medicine

## 2017-01-06 ENCOUNTER — Other Ambulatory Visit: Payer: Self-pay | Admitting: Family Medicine

## 2017-01-06 DIAGNOSIS — D509 Iron deficiency anemia, unspecified: Secondary | ICD-10-CM

## 2017-01-06 MED ORDER — VITAMIN D (ERGOCALCIFEROL) 1.25 MG (50000 UNIT) PO CAPS: 50000.0000 [IU] | ORAL_CAPSULE | ORAL | 1 refills | Status: DC

## 2017-01-06 NOTE — Assessment & Plan Note (Signed)
Check CBC and ferritin after 6 weeks of iron therapy

## 2017-01-06 NOTE — Progress Notes (Signed)
Start iron; check labs in 6 weeks Start Rx vit D

## 2017-01-07 ENCOUNTER — Telehealth: Payer: Self-pay | Admitting: Family Medicine

## 2017-01-07 MED ORDER — LIRAGLUTIDE -WEIGHT MANAGEMENT 18 MG/3ML ~~LOC~~ SOPN: 0.6000 mg | PEN_INJECTOR | Freq: Every day | SUBCUTANEOUS | 0 refills | Status: DC

## 2017-01-07 NOTE — Telephone Encounter (Signed)
Patient was returning [place] call.  Patient can be reached @ [phone].

## 2017-01-07 NOTE — Telephone Encounter (Signed)
Patient called back about ins not covering the saxenda she said she looked it up and it said victoza?

## 2017-01-07 NOTE — Telephone Encounter (Signed)
I'm not going to use Victoza I re-sent the Rx for Saxenda to her pharmacy (see her MyChart message)

## 2017-02-25 ENCOUNTER — Other Ambulatory Visit: Payer: Self-pay | Admitting: Family Medicine

## 2017-02-25 NOTE — Telephone Encounter (Signed)
Left detailed voicemail

## 2017-02-25 NOTE — Telephone Encounter (Signed)
I'm denying request for Rx vitamin D Once she has finished the 8 weeks of precription vitamin D, she can just start 1,000 iu of vitamin D3 OTC daily Thank you

## 2017-03-12 ENCOUNTER — Encounter: Payer: Self-pay | Admitting: Family Medicine

## 2017-03-12 DIAGNOSIS — H5500 Unspecified nystagmus: Secondary | ICD-10-CM | POA: Insufficient documentation

## 2017-03-12 DIAGNOSIS — R519 Headache, unspecified: Secondary | ICD-10-CM

## 2017-03-12 DIAGNOSIS — R51 Headache: Secondary | ICD-10-CM

## 2017-03-12 NOTE — Assessment & Plan Note (Signed)
Refer to neurologist 

## 2017-03-18 ENCOUNTER — Ambulatory Visit
Admission: EM | Admit: 2017-03-18 | Discharge: 2017-03-18 | Disposition: A | Discharge status: Home / Self Care | Payer: 59 | Attending: Family Medicine | Admitting: Family Medicine

## 2017-03-18 ENCOUNTER — Encounter: Payer: Self-pay | Admitting: *Deleted

## 2017-03-18 DIAGNOSIS — G43911 Migraine, unspecified, intractable, with status migrainosus: Secondary | ICD-10-CM | POA: Diagnosis not present

## 2017-03-18 MED ORDER — ONDANSETRON 4 MG PO TBDP: 4.0000 mg | ORAL_TABLET | Freq: Three times a day (TID) | ORAL | 0 refills | Status: DC | PRN

## 2017-03-18 NOTE — ED Triage Notes (Signed)
Headache x 15 days. Denies other symptoms.

## 2017-03-18 NOTE — Discharge Instructions (Signed)
Take medication as prescribed today and by neurology. Rest. Drink plenty of fluids.   As discussed, call and follow up with neurology.   Follow up with your primary care physician this week as needed. Return to Urgent care for new or worsening concerns.

## 2017-03-18 NOTE — ED Provider Notes (Signed)
[facility] ____________________________________________  Time seen: Approximately 9:36 AM  I have reviewed the triage vital signs and the nursing notes.   HISTORY  Chief Complaint Headache   HPI Sabrina Mcgee is a 26 y.o. female presenting for evaluation of headache present x 15 days, unrelieved by Fioricet, ibprofen and tylenol. Reports chronic migraines since childhood, stating headaches usually lasts no more than 2 days and goes away with medication and sleeping. States current headache unrelieved by above medications, states does not go away at all. States headache does move in location but no resolution. States seen by neurology yesterday for same complaints (Dr. Melrose Nakayama), and was started on medrol dose pack and nortriptyline. States no headache improvement since. States current headache is 7/10 to middle of headache, that goes back to front and described as throbbing. States some light sensitivity and nausea. Denies vomiting, fevers, neck or back stiffness, fall, injury, vision changes, insect bite, tick bite or attachment, cough, congestion, sore throat, or recent sickness. Denies known trigger. Denies history of similar. Denies any acute worsening of her headaches. States headaches were gradual in onset, not abrupt. Does state current headache is worse she's ever had. Denies any changes in headache in last few days.   Denies chest pain, shortness of breath, abdominal pain, dysuria, extremity pain, extremity swelling or rash. Denies recent sickness. Denies recent antibiotic use. Denies recent insect bites, tick bites or tick attachments. States no recent sickness.  Patient's last menstrual period was 03/04/2017 (approximate). Denies pregnancy Lada, Satira Anis, MD: PCP Neurology: Melrose Nakayama    Past Medical History:  Diagnosis Date  . Anemia   . Anxiety   . Depression   . Family history of adverse reaction to anesthesia    moher nausea and vomiting  . Goiter   . Vitamin  D deficiency disease     Patient Active Problem List   Diagnosis Date Noted  . Nystagmus 03/12/2017  . Headache 03/12/2017  . Obesity (BMI 35.0-39.9 without comorbidity) 01/01/2017  . Headache, chronic daily 03/22/2016  . Microcytic anemia 02/28/2015  . Vitamin D deficiency 02/28/2015  . Social anxiety disorder 02/27/2015  . Depression 02/27/2015  . Allergic rhinitis 02/27/2015    Past Surgical History:  Procedure Laterality Date  . ROBOTIC ASSISTED LAPAROSCOPIC CHOLECYSTECTOMY N/A 10/10/2015   Procedure: ROBOTIC ASSISTED LAPAROSCOPIC CHOLECYSTECTOMY;  Surgeon: Clayburn Pert, MD;  Location: [facility] ORS;  Service: General;  Laterality: N/A;  . TONSILLECTOMY  2004     No current facility-administered medications for this encounter.   Current Outpatient Prescriptions:  .  butalbital-acetaminophen-caffeine (ESGIC) 50-325-40 MG tablet, Take 1 tablet by mouth every 6 (six) hours as needed for headache., Disp: 20 tablet, Rfl: 0 .  escitalopram (LEXAPRO) 10 MG tablet, TAKE 1.5 TABLETS BY MOUTH EVERY EVENING, Disp: 45 tablet, Rfl: 5 .  Multiple Vitamin (MULTIVITAMIN) tablet, Take 1 tablet by mouth every evening. , Disp: , Rfl:  .  Insulin Pen Needle (PEN NEEDLES) 31G X 6 MM MISC, Use daily with Saxenda pen, Disp: 90 each, Rfl: 1 .  Liraglutide -Weight Management (SAXENDA) 18 MG/3ML SOPN, Inject 0.6 mg into the skin daily. x 1 week, then 1.2 mg daily x 1 week, then 1.8 mg daily x 1 week, then 2.4 mg daily x 1 week, then 3 mg daily, Disp: 3 mL, Rfl: 0 .  ondansetron (ZOFRAN ODT) 4 MG disintegrating tablet, Take 1 tablet (4 mg total) by mouth every 8 (eight) hours as needed., Disp: 15 tablet, Rfl: 0  Allergies Shellfish  allergy  Family History  Problem Relation Age of Onset  . Heart disease Mother   . Hypertension Mother   . Cancer Father        kidney  . Heart disease Father   . Hypertension Father   . Cancer Paternal Aunt        breast  . Heart disease Maternal Grandmother   .  Hypertension Maternal Grandmother   . Heart disease Maternal Grandfather   . Hypertension Maternal Grandfather   . Heart disease Paternal Grandmother   . Hypertension Paternal Grandmother   . COPD Paternal Grandmother   . Cancer Paternal Grandfather        prostate  . Heart disease Paternal Grandfather   . Hypertension Paternal Grandfather   . Diabetes Paternal Aunt     Social History Social History  Substance Use Topics  . Smoking status: Never Smoker  . Smokeless tobacco: Never Used  . Alcohol use No    Review of Systems Constitutional: No fever/chills Eyes: No visual changes. ENT: No sore throat. Cardiovascular: Denies chest pain. Respiratory: Denies shortness of breath. Gastrointestinal: No abdominal pain.  No nausea, no vomiting.  No diarrhea.  No constipation. Genitourinary: Negative for dysuria. Musculoskeletal: Negative for back pain. Skin: Negative for rash. Neurological: Negative for focal weakness or numbness.   ____________________________________________   PHYSICAL EXAM:  VITAL SIGNS: ED Triage Vitals  Enc Vitals Group     BP 03/18/17 0854 121/76     Pulse Rate 03/18/17 0854 75     Resp 03/18/17 0854 16     Temp 03/18/17 0854 98.3 F (36.8 C)     Temp Source 03/18/17 0854 Oral     SpO2 03/18/17 0854 100 %     Weight 03/18/17 0856 225 lb (102.1 kg)     Height 03/18/17 0856 5\' 7"  (1.702 m)     Head Circumference --      Peak Flow --      Pain Score 03/18/17 0858 7     Pain Loc --      Pain Edu? --      Excl. in [place]? --    Constitutional: Alert and oriented. Well appearing and in no acute distress. Eyes: Conjunctivae are normal. PERRL. EOMI. No pain with EOMs. No papilledema visualized during bedside exam. Head: Atraumatic. No tenderness over temporal arteries. No sinus TTP. No tenderness to palpation.   Ears: no erythema, normal TMs bilaterally.   Nose: No congestion/rhinnorhea.  Mouth/Throat: Mucous membranes are moist.  Oropharynx  non-erythematous. Neck: No stridor.  No cervical spine tenderness to palpation.  Hematological/Lymphatic/Immunilogical: No cervical lymphadenopathy. Cardiovascular: Normal rate, regular rhythm. Grossly normal heart sounds.  Good peripheral circulation. Respiratory: Normal respiratory effort.  No retractions. No wheezes, rales or rhonchi.  Gastrointestinal: Soft and nontender. No distention.  Musculoskeletal: No lower or upper extremity tenderness nor edema noted.  No cervical, thoracic or lumbar tenderness to palpation.  Neurologic:  Normal speech and language. No gross focal neurologic deficits are appreciated. No gait instability. No ataxia. Normal heel to shin. Negative Romberg. No meningismus. Sensation intact to bilateral face, upper and lower extremities. Keeps eye contact. Skin:  Skin is warm, dry and intact. No rash noted. Psychiatric: Mood and affect are normal. Speech and behavior are normal.  ___________________________________________   LABS (all labs ordered are listed, but only abnormal results are displayed)  Labs Reviewed - No data to display  PROCEDURES Procedures   INITIAL IMPRESSION / ASSESSMENT AND PLAN / ED COURSE  Pertinent labs & imaging results that were available during my care of the patient were reviewed by me and considered in my medical decision making (see chart for details).  Very well-appearing patient. Smiling during conversations. Quick movements and steady gait in room. No focal neurological deficits. Patient seen by neurology yesterday for the same complaints. Patient denies any worsening of headache visit yesterday. Patient has had one day of prescribed medication from neurology. Patient states that she is here today requesting imaging stating she is concerned that she has a "pituitary issue" or "something wrong the brainstem ".  Discussed with patient will attempt to reach neuro.   Called and discussed patient with neurologist Dr. Melrose Nakayama who saw and  evaluated the patient yesterday. In discussing with Dr. Melrose Nakayama, no clear indication for CT of head at this time, neurology recommends for patient to continue prescribed medications for 2-3 days, and if at that point not improving to then follow back up with neurology and likely imaging will then be ordered outpatient. Discussed this with patient and patient agreed to this plan. Discussed strict follow-up and return parameters, including for any worsening proceeding directly to the emergency room. Patient states that she has been having some nausea intermittently with this, patient declines any nausea medicine in urgent care, Rx for Zofran given. Discussed indication, risks and benefits of medications with patient.  Discussed follow up with Primary care physician this week. Discussed follow up and return parameters including no resolution or any worsening concerns. Patient verbalized understanding and agreed to plan. Discussed patient and plan of care with Dr. Karmen Bongo who agrees with plan.   ____________________________________________   FINAL CLINICAL IMPRESSION(S) / ED DIAGNOSES  Final diagnoses:  Intractable migraine with status migrainosus, unspecified migraine type     Discharge Medication List as of 03/18/2017 10:08 AM    START taking these medications   Details  ondansetron (ZOFRAN ODT) 4 MG disintegrating tablet Take 1 tablet (4 mg total) by mouth every 8 (eight) hours as needed., Starting Tue 03/18/2017, Normal        Note: This dictation was prepared with Dragon dictation along with smaller phrase technology. Any transcriptional errors that result from this process are unintentional.         Marylene Land, NP 03/18/17 1723

## 2017-04-21 ENCOUNTER — Encounter: Payer: Self-pay | Admitting: *Deleted

## 2017-04-21 ENCOUNTER — Ambulatory Visit
Admission: EM | Admit: 2017-04-21 | Discharge: 2017-04-21 | Disposition: A | Discharge status: Home / Self Care | Payer: 59 | Attending: Family Medicine | Admitting: Family Medicine

## 2017-04-21 DIAGNOSIS — J029 Acute pharyngitis, unspecified: Secondary | ICD-10-CM

## 2017-04-21 LAB — RAPID STREP SCREEN (MED CTR MEBANE ONLY): Streptococcus, Group A Screen (Direct): NEGATIVE

## 2017-04-21 MED ORDER — LIDOCAINE VISCOUS 2 % MT SOLN: 15.0000 mL | Freq: Three times a day (TID) | OROMUCOSAL | 0 refills | Status: DC | PRN

## 2017-04-21 NOTE — ED Provider Notes (Signed)
[facility] ____________________________________________  Time seen: Approximately 2:59 PM  I have reviewed the triage vital signs and the nursing notes.   HISTORY  Chief Complaint  Sore Throat   HPI Sabrina Mcgee is a 26 y.o. female in for evaluation of sore throat present for the last 4-5 days. Patient states sore throat is currently mild, at times moderate. States sore throat does hurt to swallow but has continued to eat and drink well. Denies fevers, rash, neck stiffness, neck pain, back pain, dysuria, cough, congestion, rash or other changes. Reports some sick contacts at work with cough and congestion symptoms, no sore throat. Denies home sick contacts. No over-the-counter medications taken for the same complaint. Denies other aggravating or alleviating factors. Reports otherwise feels well. Denies chest pain, shortness of breath, abdominal pain, dysuria, or rash. Denies recent sickness. Denies recent antibiotic use.   Patient's last menstrual period was 04/07/2017. Denies pregnancy.  Arnetha Courser, MD: PCP  Past Medical History:  Diagnosis Date  . Anemia   . Anxiety   . Depression   . Family history of adverse reaction to anesthesia    moher nausea and vomiting  . Goiter   . Vitamin D deficiency disease     Patient Active Problem List   Diagnosis Date Noted  . Nystagmus 03/12/2017  . Headache 03/12/2017  . Obesity (BMI 35.0-39.9 without comorbidity) 01/01/2017  . Headache, chronic daily 03/22/2016  . Microcytic anemia 02/28/2015  . Vitamin D deficiency 02/28/2015  . Social anxiety disorder 02/27/2015  . Depression 02/27/2015  . Allergic rhinitis 02/27/2015    Past Surgical History:  Procedure Laterality Date  . ROBOTIC ASSISTED LAPAROSCOPIC CHOLECYSTECTOMY N/A 10/10/2015   Procedure: ROBOTIC ASSISTED LAPAROSCOPIC CHOLECYSTECTOMY;  Surgeon: Clayburn Pert, MD;  Location: [facility] ORS;  Service: General;  Laterality: N/A;  . TONSILLECTOMY  2004      No current facility-administered medications for this encounter.   Current Outpatient Prescriptions:  .  cholecalciferol (VITAMIN D) 1000 units tablet, Take 1,000 Units by mouth once a week., Disp: , Rfl:  .  escitalopram (LEXAPRO) 10 MG tablet, TAKE 1.5 TABLETS BY MOUTH EVERY EVENING, Disp: 45 tablet, Rfl: 5 .  Ferrous Sulfate (IRON) 325 (65 Fe) MG TABS, Take by mouth daily., Disp: , Rfl:  .  Multiple Vitamin (MULTIVITAMIN) tablet, Take 1 tablet by mouth every evening. , Disp: , Rfl:  .  butalbital-acetaminophen-caffeine (ESGIC) 50-325-40 MG tablet, Take 1 tablet by mouth every 6 (six) hours as needed for headache., Disp: 20 tablet, Rfl: 0 .  Insulin Pen Needle (PEN NEEDLES) 31G X 6 MM MISC, Use daily with Saxenda pen, Disp: 90 each, Rfl: 1 .  lidocaine (XYLOCAINE) 2 % solution, Use as directed 15 mLs in the mouth or throat every 8 (eight) hours as needed (sore throat. gargle and spit as needed for sore throat.)., Disp: 100 mL, Rfl: 0 .  Liraglutide -Weight Management (SAXENDA) 18 MG/3ML SOPN, Inject 0.6 mg into the skin daily. x 1 week, then 1.2 mg daily x 1 week, then 1.8 mg daily x 1 week, then 2.4 mg daily x 1 week, then 3 mg daily, Disp: 3 mL, Rfl: 0 .  ondansetron (ZOFRAN ODT) 4 MG disintegrating tablet, Take 1 tablet (4 mg total) by mouth every 8 (eight) hours as needed., Disp: 15 tablet, Rfl: 0  Allergies Shellfish allergy  Family History  Problem Relation Age of Onset  . Heart disease Mother   . Hypertension Mother   . Cancer Father  kidney  . Heart disease Father   . Hypertension Father   . Cancer Paternal Aunt        breast  . Heart disease Maternal Grandmother   . Hypertension Maternal Grandmother   . Heart disease Maternal Grandfather   . Hypertension Maternal Grandfather   . Heart disease Paternal Grandmother   . Hypertension Paternal Grandmother   . COPD Paternal Grandmother   . Cancer Paternal Grandfather        prostate  . Heart disease Paternal  Grandfather   . Hypertension Paternal Grandfather   . Diabetes Paternal Aunt     Social History Social History  Substance Use Topics  . Smoking status: Never Smoker  . Smokeless tobacco: Never Used  . Alcohol use No    Review of Systems Constitutional: No fever/chills Eyes: No visual changes. ENT: Positive sore throat. Cardiovascular: Denies chest pain. Respiratory: Denies shortness of breath. Gastrointestinal: No abdominal pain.  No nausea, no vomiting.   Genitourinary: Negative for dysuria. Musculoskeletal: Negative for back pain. Skin: Negative for rash. Neurological: Negative for focal weakness or numbness. Reports has been having headaches intermittently for the last few months, followed by a neurologist, denies any changes in her headaches.    ____________________________________________   PHYSICAL EXAM:  VITAL SIGNS: ED Triage Vitals  Enc Vitals Group     BP 04/21/17 1302 113/78     Pulse Rate 04/21/17 1302 78     Resp 04/21/17 1302 16     Temp 04/21/17 1302 98.8 F (37.1 C)     Temp Source 04/21/17 1302 Oral     SpO2 04/21/17 1302 100 %     Weight --      Height --      Head Circumference --      Peak Flow --      Pain Score 04/21/17 1304 5     Pain Loc --      Pain Edu? --      Excl. in [place]? --    Constitutional: Alert and oriented. Well appearing and in no acute distress. Eyes: Conjunctivae are normal. Head: Atraumatic. No sinus tenderness to palpation. No swelling. No erythema.  Ears: no erythema, normal TMs bilaterally. No surrounding swelling, erythema, or tenderness bilaterally.   Nose:no nasal congestion or rhinorrhea.   Mouth/Throat: Mucous membranes are moist. Mild pharyngeal erythema. Tonsils surgically absent. No exudate.   Neck: No stridor.  No cervical spine tenderness to palpation. No meningismus.  Hematological/Lymphatic/Immunilogical: No cervical lymphadenopathy. Cardiovascular: Normal rate, regular rhythm. Grossly normal heart sounds.   Good peripheral circulation. Respiratory: Normal respiratory effort.  No retractions. No wheezes, rales or rhonchi. Good air movement.  Gastrointestinal: Soft and nontender. No CVA tenderness. No hepatosplenomegaly palpated.  Musculoskeletal: Ambulatory with steady gait. No cervical, thoracic or lumbar tenderness to palpation. Neurologic:  Normal speech and language. No gait instability. Skin:  Skin appears warm, dry and intact. No rash noted. Psychiatric: Mood and affect are normal. Speech and behavior are normal.  ___________________________________________   LABS (all labs ordered are listed, but only abnormal results are displayed)  Labs Reviewed  RAPID STREP SCREEN (NOT AT [facility])  CULTURE, GROUP A STREP [facility])     PROCEDURES Procedures   INITIAL IMPRESSION / ASSESSMENT AND PLAN / ED COURSE  Pertinent labs & imaging results that were available during my care of the patient were reviewed by me and considered in my medical decision making (see chart for details).  Well-appearing patient. Quick strep negative, will culture.  Mild pharyngeal erythema. Suspect viral pharyngitis. Discussed evaluation of mono, patient declined at this time. Encouraged rest, fluids, supportive care, when necessary viscous lidocaine gargles.Discussed indication, risks and benefits of medications with patient.  Discussed follow up with Primary care physician this week. Discussed follow up and return parameters including no resolution or any worsening concerns. Patient verbalized understanding and agreed to plan.   ____________________________________________   FINAL CLINICAL IMPRESSION(S) / ED DIAGNOSES  Final diagnoses:  Pharyngitis, unspecified etiology     Discharge Medication List as of 04/21/2017  2:18 PM    START taking these medications   Details  lidocaine (XYLOCAINE) 2 % solution Use as directed 15 mLs in the mouth or throat every 8 (eight) hours as needed (sore throat. gargle and spit  as needed for sore throat.)., Starting Mon 04/21/2017, Normal        Note: This dictation was prepared with Dragon dictation along with smaller phrase technology. Any transcriptional errors that result from this process are unintentional.         Marylene Land, NP 04/21/17 1505

## 2017-04-21 NOTE — Discharge Instructions (Signed)
Take medication as prescribed. Rest. Drink plenty of fluids.  ° °Follow up with your primary care physician this week as needed. Return to Urgent care for new or worsening concerns.  ° °

## 2017-04-21 NOTE — ED Triage Notes (Signed)
Patient started having symptom of sore throat 5 days ago. No other symptoms reported.

## 2017-04-24 LAB — CULTURE, GROUP A STREP (THRC)

## 2017-04-30 ENCOUNTER — Encounter: Payer: Self-pay | Admitting: Neurology

## 2017-04-30 ENCOUNTER — Ambulatory Visit (INDEPENDENT_AMBULATORY_CARE_PROVIDER_SITE_OTHER): Payer: 59 | Admitting: Neurology

## 2017-04-30 DIAGNOSIS — G43709 Chronic migraine without aura, not intractable, without status migrainosus: Secondary | ICD-10-CM

## 2017-04-30 DIAGNOSIS — R51 Headache: Secondary | ICD-10-CM | POA: Diagnosis not present

## 2017-04-30 DIAGNOSIS — IMO0002 Reserved for concepts with insufficient information to code with codable children: Secondary | ICD-10-CM | POA: Insufficient documentation

## 2017-04-30 DIAGNOSIS — R635 Abnormal weight gain: Secondary | ICD-10-CM | POA: Insufficient documentation

## 2017-04-30 DIAGNOSIS — R519 Headache, unspecified: Secondary | ICD-10-CM

## 2017-04-30 MED ORDER — RIZATRIPTAN BENZOATE 10 MG PO TBDP: 10.0000 mg | ORAL_TABLET | ORAL | 11 refills | Status: DC | PRN

## 2017-04-30 MED ORDER — TOPIRAMATE 100 MG PO TABS: 100.0000 mg | ORAL_TABLET | Freq: Two times a day (BID) | ORAL | 11 refills | Status: DC

## 2017-04-30 MED ORDER — ONDANSETRON 4 MG PO TBDP: 4.0000 mg | ORAL_TABLET | Freq: Three times a day (TID) | ORAL | 11 refills | Status: DC | PRN

## 2017-04-30 NOTE — Patient Instructions (Signed)
You should stop daily Aleve, Excedrin migraine  Take Maxalt as needed, may combine it with   Zofran Aleve  Topamax daily as preventive medications

## 2017-04-30 NOTE — Progress Notes (Signed)
PATIENT: Sabrina Mcgee DOB: 10-30-1990  Chief Complaint  Patient presents with  . Migraine    Reports daily headaches that are often associated with nausea, dizziness and light sensitivity.  She as been using Aleve or ibuprofen 1-2 times daily in attempt to relieve the pain.  Marland Kitchen PCP    Lada, Satira Anis, MD     HISTORICAL  Sabrina Mcgee is a 26 years old female, seen in refer by her primary care doctor Arnetha Courser, for evaluation of migraine headaches, initial evaluation was on April 30 2017.  I reviewed and summarized the referring note, she has past medical history of iron deficiency anemia, on supplement, depression anxiety  She reported a history of chronic migraine since elementary school, used to happen occasionally, over the past 5 years she has increased migraine headache, she has been taking daily Aleve, or Excedrin Migraine, since 2018, she been having prolonged headaches, can have headache lasting for one month, at least a week out of 4 months she has to dealing with moderate to severe migraine headache, her typical headaches are lateralized severe pounding headache with associated light noise sensitivity, nauseous,   She has never tried preventive medication or abortive treatment in the past,  In addition she is concerned about her rapid weight gain 25 pounds in 6 months, worry about the possibility of pituitary adenoma  Laboratory evaluation in 2018, normal TSH, vitamin D level was mildly decreased 21, negative HIV, CBC showed mildly anemia hemoglobin of 10.5, normal CMP,   REVIEW OF SYSTEMS: Full 14 system review of systems performed and notable only for headache, migraine, insomnia, depression anxiety too much sleep not enough energy, weight gain  ALLERGIES: Allergies  Allergen Reactions  . Shellfish Allergy Rash    Migraines and nauseas     HOME MEDICATIONS: Current Outpatient Prescriptions  Medication Sig Dispense Refill  . cholecalciferol (VITAMIN  D) 1000 units tablet Take 1,000 Units by mouth once a week.    . escitalopram (LEXAPRO) 10 MG tablet TAKE 1.5 TABLETS BY MOUTH EVERY EVENING 45 tablet 5  . Ferrous Sulfate (IRON) 325 (65 Fe) MG TABS Take by mouth daily.    . Liraglutide -Weight Management (SAXENDA) 18 MG/3ML SOPN Inject 0.6 mg into the skin daily. x 1 week, then 1.2 mg daily x 1 week, then 1.8 mg daily x 1 week, then 2.4 mg daily x 1 week, then 3 mg daily 3 mL 0  . Multiple Vitamin (MULTIVITAMIN) tablet Take 1 tablet by mouth every evening.      No current facility-administered medications for this visit.     PAST MEDICAL HISTORY: Past Medical History:  Diagnosis Date  . Anemia   . Anxiety   . Depression   . Family history of adverse reaction to anesthesia    moher nausea and vomiting  . Goiter   . Migraine   . Pituitary adenoma ([place])   . Vitamin D deficiency disease     PAST SURGICAL HISTORY: Past Surgical History:  Procedure Laterality Date  . ROBOTIC ASSISTED LAPAROSCOPIC CHOLECYSTECTOMY N/A 10/10/2015   Procedure: ROBOTIC ASSISTED LAPAROSCOPIC CHOLECYSTECTOMY;  Surgeon: Clayburn Pert, MD;  Location: [facility] ORS;  Service: General;  Laterality: N/A;  . TONSILLECTOMY  2004    FAMILY HISTORY: Family History  Problem Relation Age of Onset  . Heart disease Mother   . Hypertension Mother   . Cancer Father        kidney  . Heart disease Father   .  Hypertension Father   . Cancer Paternal Aunt        breast  . Heart disease Maternal Grandmother   . Hypertension Maternal Grandmother   . Heart disease Maternal Grandfather   . Hypertension Maternal Grandfather   . Heart disease Paternal Grandmother   . Hypertension Paternal Grandmother   . COPD Paternal Grandmother   . Cancer Paternal Grandfather        prostate  . Heart disease Paternal Grandfather   . Hypertension Paternal Grandfather   . Diabetes Paternal Aunt     SOCIAL HISTORY:  Social History   Social History  . Marital status: Single     Spouse name: N/A  . Number of children: 0  . Years of education: Bachelors   Occupational History  . Retail    Social History Main Topics  . Smoking status: Never Smoker  . Smokeless tobacco: Never Used  . Alcohol use No  . Drug use: No  . Sexual activity: Yes    Birth control/ protection: None   Other Topics Concern  . Not on file   Social History Narrative   Lives at home with parents.   Left-handed.   Occasional caffeine use.     PHYSICAL EXAM   Vitals:   04/30/17 1021  BP: 110/72  Pulse: 76  Weight: 224 lb 12 oz (101.9 kg)  Height: 5\' 7"  (1.702 m)    Not recorded      Body mass index is 35.2 kg/m.  PHYSICAL EXAMNIATION:  Gen: NAD, conversant, well nourised, obese, well groomed                     Cardiovascular: Regular rate rhythm, no peripheral edema, warm, nontender. Eyes: Conjunctivae clear without exudates or hemorrhage Neck: Supple, no carotid bruits. Pulmonary: Clear to auscultation bilaterally   NEUROLOGICAL EXAM:  MENTAL STATUS: Speech:    Speech is normal; fluent and spontaneous with normal comprehension.  Cognition:     Orientation to time, place and person     Normal recent and remote memory     Normal Attention span and concentration     Normal Language, naming, repeating,spontaneous speech     Fund of knowledge   CRANIAL NERVES: CN II: Visual fields are full to confrontation. Fundoscopic exam is normal with sharp discs and no vascular changes. Pupils are round equal and briskly reactive to light. CN III, IV, VI: extraocular movement are normal. No ptosis. CN V: Facial sensation is intact to pinprick in all 3 divisions bilaterally. Corneal responses are intact.  CN VII: Face is symmetric with normal eye closure and smile. CN VIII: Hearing is normal to rubbing fingers CN IX, X: Palate elevates symmetrically. Phonation is normal. CN XI: Head turning and shoulder shrug are intact CN XII: Tongue is midline with normal movements and no  atrophy.  MOTOR: There is no pronator drift of out-stretched arms. Muscle bulk and tone are normal. Muscle strength is normal.  REFLEXES: Reflexes are 2+ and symmetric at the biceps, triceps, knees, and ankles. Plantar responses are flexor.  SENSORY: Intact to light touch, pinprick, positional sensation and vibratory sensation are intact in fingers and toes.  COORDINATION: Rapid alternating movements and fine finger movements are intact. There is no dysmetria on finger-to-nose and heel-knee-shin.    GAIT/STANCE: Posture is normal. Gait is steady with normal steps, base, arm swing, and turning. Heel and toe walking are normal. Tandem gait is normal.  Romberg is absent.   DIAGNOSTIC DATA (  LABS, IMAGING, TESTING) - I reviewed patient records, labs, notes, testing and imaging myself where available.   ASSESSMENT AND PLAN  CAMYLLE WHICKER is a 26 y.o. female   Chronic migraine headaches  A component of medicine rebound headache  Stop daily Aleve, Excedrin Migraine use  Topamax twice a day as preventive medication  Maxalt as needed, together with Zofran  Rapid weight gain:  She is concerned about the possibility of pituitary tumor, desires further evaluation, and ordered MRI of the brain w/wo contrast   Marcial Pacas, M.D. Ph.D.  [facility] [address], [place], [place] [address] Ph: [phone] Fax: [phone]  CC: Arnetha Courser, MD

## 2017-05-14 ENCOUNTER — Ambulatory Visit
Admission: RE | Admit: 2017-05-14 | Discharge: 2017-05-14 | Disposition: A | Discharge status: Home / Self Care | Payer: 59 | Source: Ambulatory Visit | Attending: Neurology | Admitting: Neurology

## 2017-05-14 DIAGNOSIS — R519 Headache, unspecified: Secondary | ICD-10-CM

## 2017-05-14 DIAGNOSIS — R51 Headache: Principal | ICD-10-CM

## 2017-05-14 MED ORDER — GADOBENATE DIMEGLUMINE 529 MG/ML IV SOLN
20.0000 mL | Freq: Once | INTRAVENOUS | Status: AC | PRN
  Administered 2017-05-14: 20 mL via INTRAVENOUS

## 2017-05-23 ENCOUNTER — Other Ambulatory Visit: Payer: Self-pay | Admitting: Family Medicine

## 2017-05-23 MED ORDER — ESCITALOPRAM OXALATE 10 MG PO TABS: ORAL_TABLET | ORAL | 5 refills | Status: DC

## 2017-09-21 IMAGING — NM NM HEPATO W/GB/PHARM/[PERSON_NAME]
2 series · 12 of 12 positions shown · non-contrast
Comparison: None.

CLINICAL DATA: Right upper quadrant pain for 1 month

EXAM:
NUCLEAR MEDICINE HEPATOBILIARY IMAGING WITH GALLBLADDER EF
TECHNIQUE: Sequential images of the abdomen were obtained [DATE] minutes
following intravenous administration of radiopharmaceutical. After
oral ingestion of 8 ounces of Half-and-Half cream, gallbladder
ejection fraction was determined.
RADIOPHARMACEUTICALS:  5.2 mCi Rc-KKm Choletec IV

[Series 1: biliary · 4.14mm/px · 6 of 51 frames shown]
[frame 5/51]
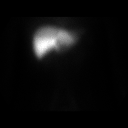
[frame 13/51]
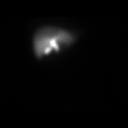
[frame 22/51]
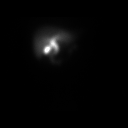
[frame 30/51]
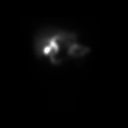
[frame 39/51]
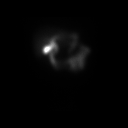
[frame 47/51]
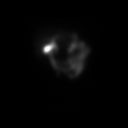

[Series 3: gbef · 4.14mm/px · 6 of 60 frames shown]
[frame 6/60]
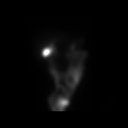
[frame 16/60]
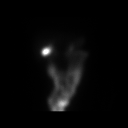
[frame 26/60]
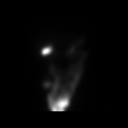
[frame 36/60]
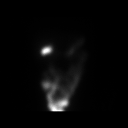
[frame 46/60]
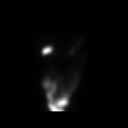
[frame 56/60]
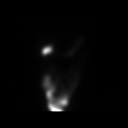

[12 of 12 positions shown; findings below may reference images not displayed]

FINDINGS: Gallbladder activity occurs after 12 minutes. Small bowel activity
occurs after 21 minutes. Gallbladder ejection fraction is 31%.
Normal is 33% or greater.
IMPRESSION: Cystic and common bile ducts are patent.

Abnormally low gallbladder ejection fraction at 31%. Normal is
greater than or equal to 33%.

## 2017-10-29 ENCOUNTER — Ambulatory Visit: Payer: 59 | Admitting: Neurology

## 2018-01-26 ENCOUNTER — Other Ambulatory Visit: Payer: Self-pay | Admitting: Family Medicine

## 2018-01-26 NOTE — Telephone Encounter (Signed)
Please ask patient to schedule a visit with me in the next month I refilled her medicine

## 2018-01-26 NOTE — Telephone Encounter (Signed)
[phone]  Left voice message on [phone] @ 11:30 informing pt a script has been sent to the pharmacy and for her to schedule appt within a month

## 2018-02-05 ENCOUNTER — Ambulatory Visit (INDEPENDENT_AMBULATORY_CARE_PROVIDER_SITE_OTHER): Payer: 59 | Admitting: Family Medicine

## 2018-02-05 ENCOUNTER — Encounter: Payer: Self-pay | Admitting: Family Medicine

## 2018-02-05 VITALS — BP 122/84 | HR 99 | Temp 98.7°F | Resp 12 | Ht 67.0 in | Wt 246.4 lb

## 2018-02-05 DIAGNOSIS — E669 Obesity, unspecified: Secondary | ICD-10-CM | POA: Diagnosis not present

## 2018-02-05 DIAGNOSIS — R635 Abnormal weight gain: Secondary | ICD-10-CM

## 2018-02-05 DIAGNOSIS — D509 Iron deficiency anemia, unspecified: Secondary | ICD-10-CM | POA: Diagnosis not present

## 2018-02-05 DIAGNOSIS — E559 Vitamin D deficiency, unspecified: Secondary | ICD-10-CM | POA: Diagnosis not present

## 2018-02-05 DIAGNOSIS — G43109 Migraine with aura, not intractable, without status migrainosus: Secondary | ICD-10-CM

## 2018-02-05 MED ORDER — ESCITALOPRAM OXALATE 20 MG PO TABS: 20.0000 mg | ORAL_TABLET | Freq: Every day | ORAL | 11 refills | Status: DC

## 2018-02-05 NOTE — Progress Notes (Signed)
BP 122/84   Pulse 99   Temp 98.7 F (37.1 C) (Oral)   Resp 12   Ht 5\' 7"  (1.702 m)   Wt 246 lb 6.4 oz (111.8 kg)   LMP 01/19/2018   SpO2 98%   BMI 38.59 kg/m    Subjective:    Patient ID: Sabrina Mcgee, female    DOB: 1991/05/30, 27 y.o.   MRN: [id number]  HPI: Sabrina Mcgee is a 27 y.o. female  Chief Complaint  Patient presents with  . Follow-up    HPI No medical excitement since last visit Here for follow-up  Normal periods "for me" she says; long and heavy periods; she has seen a gynecologist; they talked with her about a hysterectomy; she has not tried any OCPs; Dr. Leonides Schanz; energy is "meh"; hx of iron deficiency in the past  Abnormal weight gain; hx of thyroid in the family; some constipation, varies; obesity; walks 1-2 miles a day; some caloric drinks; not much fast food  Hx of diabetes in the family  Depression; she thinks the medicine is doing okay at 20 mg (up from 15 mg)  Migraine headaches; not taking topamax; uses zofran PRN; maybe 4 migraines a month; does get aura  Depression screen [facility] 2/9 02/05/2018 01/01/2017 09/05/2016 03/22/2016 11/21/2015  Decreased Interest 0 1 1 0 0  Down, Depressed, Hopeless 1 1 1  0 0  PHQ - 2 Score 1 2 2  0 0  Altered sleeping 1 1 1  - -  Tired, decreased energy 3 1 1  - -  Change in appetite 2 1 1  - -  Feeling bad or failure about yourself  3 1 1  - -  Trouble concentrating 1 0 0 - -  Moving slowly or fidgety/restless 0 1 0 - -  Suicidal thoughts 0 0 0 - -  PHQ-9 Score 11 7 6  - -  Difficult doing work/chores Not difficult at all Not difficult at all Not difficult at all - -    Relevant past medical, surgical, family and social history reviewed Past Medical History:  Diagnosis Date  . Anemia   . Anxiety   . Depression   . Family history of adverse reaction to anesthesia    moher nausea and vomiting  . Goiter   . Migraine   . Pituitary adenoma ([place])   . Vitamin D deficiency disease    Past Surgical History:  Procedure  Laterality Date  . ROBOTIC ASSISTED LAPAROSCOPIC CHOLECYSTECTOMY N/A 10/10/2015   Procedure: ROBOTIC ASSISTED LAPAROSCOPIC CHOLECYSTECTOMY;  Surgeon: Clayburn Pert, MD;  Location: [facility] ORS;  Service: General;  Laterality: N/A;  . TONSILLECTOMY  2004   Family History  Problem Relation Age of Onset  . Heart disease Mother   . Hypertension Mother   . Cancer Father        kidney  . Heart disease Father   . Hypertension Father   . Cancer Paternal Aunt        breast  . Heart disease Maternal Grandmother   . Hypertension Maternal Grandmother   . Heart disease Maternal Grandfather   . Hypertension Maternal Grandfather   . Heart disease Paternal Grandmother   . Hypertension Paternal Grandmother   . COPD Paternal Grandmother   . Cancer Paternal Grandfather        prostate  . Heart disease Paternal Grandfather   . Hypertension Paternal Grandfather   . Diabetes Paternal Aunt    Social History   Tobacco Use  . Smoking status: Never Smoker  .  Smokeless tobacco: Never Used  Substance Use Topics  . Alcohol use: No  . Drug use: No    Interim medical history since last visit reviewed. Allergies and medications reviewed  Review of Systems Per HPI unless specifically indicated above     Objective:    BP 122/84   Pulse 99   Temp 98.7 F (37.1 C) (Oral)   Resp 12   Ht 5\' 7"  (1.702 m)   Wt 246 lb 6.4 oz (111.8 kg)   LMP 01/19/2018   SpO2 98%   BMI 38.59 kg/m   Wt Readings from Last 3 Encounters:  02/05/18 246 lb 6.4 oz (111.8 kg)  04/30/17 224 lb 12 oz (101.9 kg)  03/18/17 225 lb (102.1 kg)    Physical Exam  Constitutional: She appears well-developed and well-nourished.  Obese; weight gain noted  HENT:  Mouth/Throat: Mucous membranes are normal.  Eyes: EOM are normal. No scleral icterus.  Neck: No thyromegaly present.  Cardiovascular: Normal rate and regular rhythm.  Pulmonary/Chest: Effort normal and breath sounds normal.  Musculoskeletal: She exhibits no edema.    Psychiatric: She has a normal mood and affect. Her behavior is normal. Her mood appears not anxious. She does not exhibit a depressed mood.  Good eye contact with examiner    Results for orders placed or performed during the hospital encounter of 04/21/17  Rapid strep screen  Result Value Ref Range   Streptococcus, Group A Screen (Direct) NEGATIVE NEGATIVE  Culture, group A strep  Result Value Ref Range   Specimen Description THROAT    Special Requests NONE Reflexed from [id number]    Culture      NO GROUP A STREP (S.PYOGENES) ISOLATED Performed at [facility], [address] [place], [place] [address]    Report Status 04/24/2017 FINAL       Assessment & Plan:   Problem List Items Addressed This Visit      Cardiovascular and Mediastinum   Migraine headache with aura (Chronic)    Avoid suspected triggers      Relevant Medications   Butalbital-Acetaminophen 25-325 MG TABS   escitalopram (LEXAPRO) 20 MG tablet     Other   Vitamin D deficiency   Relevant Orders   VITAMIN D 25 Hydroxy (Vit-D Deficiency, Fractures)   Obesity (BMI 35.0-39.9 without comorbidity)   Relevant Orders   COMPLETE METABOLIC PANEL WITH GFR   Lipid panel   Weight gain    Check thyroid function      Microcytic anemia - Primary    Check labs today      Relevant Orders   CBC with Differential/Platelet   Fe+TIBC+Fer    Other Visit Diagnoses    Abnormal weight gain       Relevant Orders   TSH   T4, free       Follow up plan: Return in about 3 months (around 05/08/2018) for follow-up visit with Dr. Sanda Klein.  An after-visit summary was printed and given to the patient at [facility].  Please see the patient instructions which may contain other information and recommendations beyond what is mentioned above in the assessment and plan.  Meds ordered this encounter  Medications  . escitalopram (LEXAPRO) 20 MG tablet    Sig: Take 1 tablet (20 mg total) by mouth daily.    Dispense:  30 tablet     Refill:  11    Orders Placed This Encounter  Procedures  . CBC with Differential/Platelet  . Fe+TIBC+Fer  . VITAMIN  D 25 Hydroxy (Vit-D Deficiency, Fractures)  . TSH  . T4, free  . COMPLETE METABOLIC PANEL WITH GFR  . Lipid panel

## 2018-02-05 NOTE — Assessment & Plan Note (Signed)
Check thyroid function 

## 2018-02-05 NOTE — Assessment & Plan Note (Signed)
Check labs today.

## 2018-02-05 NOTE — Assessment & Plan Note (Signed)
Avoid suspected triggers

## 2018-02-05 NOTE — Patient Instructions (Addendum)
Continue 20 mg Lexapro daily (that is the maximum dose so call with any issues) Check out the information at familydoctor.org entitled "Nutrition for Weight Loss: What You Need to Know about Fad Diets" Try to lose between 1-2 pounds per week by taking in fewer calories and burning off more calories You can succeed by limiting portions, limiting foods dense in calories and fat, becoming more active, and drinking 8 glasses of water a day (64 ounces) Don't skip meals, especially breakfast, as skipping meals may alter your metabolism Do not use over-the-counter weight loss pills or gimmicks that claim rapid weight loss A healthy BMI (or body mass index) is between 18.5 and 24.9 You can calculate your ideal BMI at the [place] website ClubMonetize.fr   Preventing Unhealthy Weight Gain, Adult Staying at a healthy weight is important. When fat builds up in your body, you may become overweight or obese. These conditions put you at greater risk for developing certain health problems, such as heart disease, diabetes, sleeping problems, joint problems, and some cancers. Unhealthy weight gain is often the result of making unhealthy choices in what you eat. It is also a result of not getting enough exercise. You can make changes to your lifestyle to prevent obesity and stay as healthy as possible. What nutrition changes can be made? To maintain a healthy weight and prevent obesity:  Eat only as much as your body needs. To do this: ? Pay attention to signs that you are hungry or full. Stop eating as soon as you feel full. ? If you feel hungry, try drinking water first. Drink enough water so your urine is clear or pale yellow. ? Eat smaller portions. ? Look at serving sizes on food labels. Most foods contain more than one serving per container. ? Eat the recommended amount of calories for your gender and activity level. While most active people should eat around  2,000 calories per day, if you are trying to lose weight or are not very active, you main need to eat less calories. Talk to your health care provider or dietitian about how many calories you should eat each day.  Choose healthy foods, such as: ? Fruits and vegetables. Try to fill at least half of your plate at each meal with fruits and vegetables. ? Whole grains, such as whole wheat bread, brown rice, and quinoa. ? Lean meats, such as chicken or fish. ? Other healthy proteins, such as beans, eggs, or tofu. ? Healthy fats, such as nuts, seeds, fatty fish, and olive oil. ? Low-fat or fat-free dairy.  Check food labels and avoid food and drinks that: ? Are high in calories. ? Have added sugar. ? Are high in sodium. ? Have saturated fats or trans fats.  Limit how much you eat of the following foods: ? Prepackaged meals. ? Fast food. ? Fried foods. ? Processed meat, such as bacon, sausage, and deli meats. ? Fatty cuts of red meat and poultry with skin.  Cook foods in healthier ways, such as by baking, broiling, or grilling.  When grocery shopping, try to shop around the outside of the store. This helps you buy mostly fresh foods and avoid canned and prepackaged foods.  What lifestyle changes can be made?  Exercise at least 30 minutes 5 or more days each week. Exercising includes brisk walking, yard work, biking, running, swimming, and team sports like basketball and soccer. Ask your health care provider which exercises are safe for you.  Do not use any products  that contain nicotine or tobacco, such as cigarettes and e-cigarettes. If you need help quitting, ask your health care provider.  Limit alcohol intake to no more than 1 drink a day for nonpregnant women and 2 drinks a day for men. One drink equals 12 oz of beer, 5 oz of wine, or 1 oz of hard liquor.  Try to get 7-9 hours of sleep each night. What other changes can be made?  Keep a food and activity journal to keep track  of: ? What you ate and how many calories you had. Remember to count sauces, dressings, and side dishes. ? Whether you were active, and what exercises you did. ? Your calorie, weight, and activity goals.  Check your weight regularly. Track any changes. If you notice you have gained weight, make changes to your diet or activity routine.  Avoid taking weight-loss medicines or supplements. Talk to your health care provider before starting any new medicine or supplement.  Talk to your health care provider before trying any new diet or exercise plan. Why are these changes important? Eating healthy, staying active, and having healthy habits not only help prevent obesity, they also:  Help you to manage stress and emotions.  Help you to connect with friends and family.  Improve your self-esteem.  Improve your sleep.  Prevent long-term health problems.  What can happen if changes are not made? Being obese or overweight can cause you to develop joint or bone problems, which can make it hard for you to stay active or do activities you enjoy. Being obese or overweight also puts stress on your heart and lungs and can lead to health problems like diabetes, heart disease, and some cancers. Where to find more information: Talk with your health care provider or a dietitian about healthy eating and healthy lifestyle choices. You may also find other information through these resources:  U.S. Department of Agriculture MyPlate: FormerBoss.no  American Heart Association: www.heart.org  Centers for Disease Control and Prevention: http://www.wolf.info/  Summary  Staying at a healthy weight is important. It helps prevent certain diseases and health problems, such as heart disease, diabetes, joint problems, sleep disorders, and some cancers.  Being obese or overweight can cause you to develop joint or bone problems, which can make it hard for you to stay active or do activities you enjoy.  You can prevent  unhealthy weight gain by eating a healthy diet, exercising regularly, not smoking, limiting alcohol, and getting enough sleep.  Talk with your health care provider or a dietitian for guidance about healthy eating and healthy lifestyle choices. This information is not intended to replace advice given to you by your health care provider. Make sure you discuss any questions you have with your health care provider. Document Released: 07/09/2016 Document Revised: 08/14/2016 Document Reviewed: 08/14/2016 Elsevier Interactive Patient Education  [employer].

## 2018-02-06 DIAGNOSIS — D509 Iron deficiency anemia, unspecified: Secondary | ICD-10-CM | POA: Diagnosis not present

## 2018-02-06 DIAGNOSIS — R748 Abnormal levels of other serum enzymes: Secondary | ICD-10-CM | POA: Diagnosis not present

## 2018-02-06 DIAGNOSIS — E559 Vitamin D deficiency, unspecified: Secondary | ICD-10-CM | POA: Diagnosis not present

## 2018-02-07 ENCOUNTER — Other Ambulatory Visit: Payer: Self-pay | Admitting: Family Medicine

## 2018-02-07 DIAGNOSIS — R748 Abnormal levels of other serum enzymes: Secondary | ICD-10-CM

## 2018-02-07 MED ORDER — VITAMIN D (ERGOCALCIFEROL) 1.25 MG (50000 UNIT) PO CAPS: 50000.0000 [IU] | ORAL_CAPSULE | ORAL | 1 refills | Status: AC

## 2018-02-07 NOTE — Progress Notes (Signed)
Add on alk phos iso and ggt

## 2018-02-09 ENCOUNTER — Other Ambulatory Visit: Payer: Self-pay

## 2018-02-09 DIAGNOSIS — R748 Abnormal levels of other serum enzymes: Secondary | ICD-10-CM

## 2018-02-12 LAB — TEST AUTHORIZATION

## 2018-02-12 LAB — CBC WITH DIFFERENTIAL/PLATELET
Basophils Absolute: 27 cells/uL (ref 0–200)
Basophils Relative: 0.4 %
Eosinophils Absolute: 27 cells/uL (ref 15–500)
Eosinophils Relative: 0.4 %
HCT: 33.5 % — ABNORMAL LOW (ref 35.0–45.0)
Hemoglobin: 11.1 g/dL — ABNORMAL LOW (ref 11.7–15.5)
Lymphs Abs: 2067 cells/uL (ref 850–3900)
MCH: 24.5 pg — ABNORMAL LOW (ref 27.0–33.0)
MCHC: 33.1 g/dL (ref 32.0–36.0)
MCV: 74 fL — ABNORMAL LOW (ref 80.0–100.0)
Monocytes Relative: 7 %
Neutro Abs: 4202 cells/uL (ref 1500–7800)
Neutrophils Relative %: 61.8 %
Platelets: 207 10*3/uL (ref 140–400)
RBC: 4.53 10*6/uL (ref 3.80–5.10)
RDW: 16.8 % — ABNORMAL HIGH (ref 11.0–15.0)
Total Lymphocyte: 30.4 %
WBC mixed population: 476 cells/uL (ref 200–950)
WBC: 6.8 10*3/uL (ref 3.8–10.8)

## 2018-02-12 LAB — COMPLETE METABOLIC PANEL WITH GFR
AG Ratio: 1.4 (calc) (ref 1.0–2.5)
ALT: 14 U/L (ref 6–29)
AST: 15 U/L (ref 10–30)
Albumin: 4.2 g/dL (ref 3.6–5.1)
Alkaline phosphatase (APISO): 139 U/L — ABNORMAL HIGH (ref 33–115)
BUN: 9 mg/dL (ref 7–25)
CO2: 22 mmol/L (ref 20–32)
Calcium: 9.3 mg/dL (ref 8.6–10.2)
Chloride: 107 mmol/L (ref 98–110)
Creat: 0.63 mg/dL (ref 0.50–1.10)
GFR, Est African American: 144 mL/min/{1.73_m2} (ref >=60)
GFR, Est Non African American: 125 mL/min/{1.73_m2} (ref >=60)
Globulin: 3 g/dL (calc) (ref 1.9–3.7)
Glucose, Bld: 88 mg/dL (ref 65–99)
Potassium: 4.1 mmol/L (ref 3.5–5.3)
Sodium: 139 mmol/L (ref 135–146)
Total Bilirubin: 0.5 mg/dL (ref 0.2–1.2)
Total Protein: 7.2 g/dL (ref 6.1–8.1)

## 2018-02-12 LAB — LIPID PANEL
Cholesterol: 172 mg/dL (ref <200)
HDL: 48 mg/dL — ABNORMAL LOW (ref >50)
LDL Cholesterol (Calc): 105 mg/dL (calc) — ABNORMAL HIGH
Non-HDL Cholesterol (Calc): 124 mg/dL (calc) (ref <130)
Total CHOL/HDL Ratio: 3.6 (calc) (ref <5.0)
Triglycerides: 92 mg/dL (ref <150)

## 2018-02-12 LAB — IRON,TIBC AND FERRITIN PANEL
%SAT: 28 % (calc) (ref 16–45)
Ferritin: 8 ng/mL — ABNORMAL LOW (ref 16–154)
Iron: 125 ug/dL (ref 40–190)
TIBC: 441 mcg/dL (calc) (ref 250–450)

## 2018-02-12 LAB — GAMMA GT: GGT: 16 U/L (ref 3–40)

## 2018-02-12 LAB — ALKALINE PHOSPHATASE ISOENZYMES
Alkaline phosphatase (APISO): 148 U/L — ABNORMAL HIGH (ref 33–115)
Bone Isoenzymes: 20 % — ABNORMAL LOW (ref 28–66)
Intestinal Isoenzymes: 5 % (ref 1–24)
Liver Isoenzymes: 75 % — ABNORMAL HIGH (ref 25–69)

## 2018-02-12 LAB — VITAMIN D 25 HYDROXY (VIT D DEFICIENCY, FRACTURES): Vit D, 25-Hydroxy: 17 ng/mL — ABNORMAL LOW (ref 30–100)

## 2018-02-12 LAB — T4, FREE: Free T4: 0.9 ng/dL (ref 0.8–1.8)

## 2018-02-12 LAB — TSH: TSH: 1.36 mIU/L

## 2018-02-17 ENCOUNTER — Other Ambulatory Visit: Payer: Self-pay | Admitting: Family Medicine

## 2018-02-17 DIAGNOSIS — R748 Abnormal levels of other serum enzymes: Secondary | ICD-10-CM

## 2018-02-17 NOTE — Progress Notes (Signed)
ruq [place] ordered; pt to schedule Recheck hepatic function tests in 6 weeks

## 2018-02-22 ENCOUNTER — Other Ambulatory Visit: Payer: Self-pay | Admitting: Family Medicine

## 2018-02-24 ENCOUNTER — Ambulatory Visit: Payer: 59

## 2018-02-26 ENCOUNTER — Ambulatory Visit
Admission: RE | Admit: 2018-02-26 | Discharge: 2018-02-26 | Disposition: A | Discharge status: Home / Self Care | Payer: 59 | Source: Ambulatory Visit | Attending: Family Medicine | Admitting: Family Medicine

## 2018-02-26 DIAGNOSIS — R7989 Other specified abnormal findings of blood chemistry: Secondary | ICD-10-CM | POA: Diagnosis not present

## 2018-02-26 DIAGNOSIS — R748 Abnormal levels of other serum enzymes: Secondary | ICD-10-CM | POA: Insufficient documentation

## 2018-03-02 ENCOUNTER — Other Ambulatory Visit: Payer: Self-pay | Admitting: Family Medicine

## 2018-03-02 DIAGNOSIS — R748 Abnormal levels of other serum enzymes: Secondary | ICD-10-CM

## 2018-03-02 NOTE — Progress Notes (Signed)
Gastro referral

## 2018-03-04 ENCOUNTER — Encounter: Payer: Self-pay | Admitting: Gastroenterology

## 2018-04-09 ENCOUNTER — Other Ambulatory Visit: Payer: Self-pay | Admitting: Family Medicine

## 2018-04-16 ENCOUNTER — Encounter: Payer: Self-pay | Admitting: Gastroenterology

## 2018-04-16 ENCOUNTER — Ambulatory Visit: Payer: 59 | Admitting: Gastroenterology

## 2018-04-16 VITALS — BP 131/78 | HR 103 | Ht 67.0 in | Wt 249.8 lb

## 2018-04-16 DIAGNOSIS — R748 Abnormal levels of other serum enzymes: Secondary | ICD-10-CM | POA: Diagnosis not present

## 2018-04-17 NOTE — Progress Notes (Signed)
Vonda Antigua [address]  [place], [place] [address]  Main: [phone]  Fax: [phone]   Gastroenterology Consultation  Referring Provider:     Arnetha Courser, MD Primary Care Physician:  Arnetha Courser, MD Primary Gastroenterologist:  Dr. Vonda Antigua Reason for Consultation:     Elevated alk phos        HPI:    Chief Complaint  Patient presents with  . New Patient (Initial Visit)    referred by Dr. Sanda Klein for elevated alk. phos, liver fraction highest    Sabrina Mcgee is a 27 y.o. y/o female referred for consultation & management  by Dr. Sanda Klein, Satira Anis, MD.  Patient referred due to elevated alk phos. The patient denies abdominal or flank pain, anorexia, nausea or vomiting, dysphagia, change in bowel habits or black or bloody stools or weight loss.  Patient denies any previous or recent history of daily or heavy alcohol use.  Denies any hepatotoxic drugs.  Denies green tea use or herbal products or over-the-counter supplements.  Denies any previous history of liver disease.  Denies any family history of colon cancer or liver disease.  Denies any new medications.   Past Medical History:  Diagnosis Date  . Anemia   . Anxiety   . Depression   . Family history of adverse reaction to anesthesia    moher nausea and vomiting  . Goiter   . Migraine   . Pituitary adenoma ([place])   . Vitamin D deficiency disease     Past Surgical History:  Procedure Laterality Date  . ROBOTIC ASSISTED LAPAROSCOPIC CHOLECYSTECTOMY N/A 10/10/2015   Procedure: ROBOTIC ASSISTED LAPAROSCOPIC CHOLECYSTECTOMY;  Surgeon: Clayburn Pert, MD;  Location: [facility] ORS;  Service: General;  Laterality: N/A;  . TONSILLECTOMY  2004    Prior to Admission medications   Medication Sig Start Date End Date Taking? Authorizing Provider  Butalbital-Acetaminophen 25-325 MG TABS Take 1 tablet by mouth as needed. 03/22/16   [provider]  cholecalciferol (VITAMIN D) 1000 units  tablet Take 1,000 Units by mouth once a week.    [provider]  escitalopram (LEXAPRO) 20 MG tablet Take 1 tablet (20 mg total) by mouth daily. 02/05/18   Arnetha Courser, MD  Ferrous Sulfate (IRON) 325 (65 Fe) MG TABS Take by mouth daily.    [provider]  Multiple Vitamin (MULTIVITAMIN) tablet Take 1 tablet by mouth every evening.     [provider]  ondansetron (ZOFRAN ODT) 4 MG disintegrating tablet Take 1 tablet (4 mg total) by mouth every 8 (eight) hours as needed. Patient not taking: Reported on 02/05/2018 04/30/17   Marcial Pacas, MD  rizatriptan (MAXALT-MLT) 10 MG disintegrating tablet Take 1 tablet (10 mg total) by mouth as needed. May repeat in 2 hours if needed 04/30/17   Marcial Pacas, MD    Family History  Problem Relation Age of Onset  . Heart disease Mother   . Hypertension Mother   . Cancer Father        kidney  . Heart disease Father   . Hypertension Father   . Cancer Paternal Aunt        breast  . Heart disease Maternal Grandmother   . Hypertension Maternal Grandmother   . Heart disease Maternal Grandfather   . Hypertension Maternal Grandfather   . Heart disease Paternal Grandmother   . Hypertension Paternal Grandmother   . COPD Paternal Grandmother   . Cancer Paternal Grandfather  prostate  . Heart disease Paternal Grandfather   . Hypertension Paternal Grandfather   . Diabetes Paternal Aunt      Social History   Tobacco Use  . Smoking status: Never Smoker  . Smokeless tobacco: Never Used  Substance Use Topics  . Alcohol use: No  . Drug use: No    Allergies as of 04/16/2018 - Review Complete 04/16/2018  Allergen Reaction Noted  . Shellfish allergy Rash 01/01/2017    Review of Systems:    All systems reviewed and negative except where noted in HPI.   Physical Exam:  BP 131/78   Pulse (!) 103   Ht 5' 7"  (1.702 m)   Wt 249 lb 12.8 oz (113.3 kg)   BMI 39.12 kg/m  No LMP recorded. Psych:  Alert and cooperative.  Normal mood and affect. General:   Alert,  Well-developed, well-nourished, pleasant and cooperative in NAD Head:  Normocephalic and atraumatic. Eyes:  Sclera clear, no icterus.   Conjunctiva pink. Ears:  Normal auditory acuity. Nose:  No deformity, discharge, or lesions. Mouth:  No deformity or lesions,oropharynx pink & moist. Neck:  Supple; no masses or thyromegaly. Abdomen:  Normal bowel sounds.  No bruits.  Soft, non-tender and non-distended without masses, hepatosplenomegaly or hernias noted.  No guarding or rebound tenderness.    Msk:  Symmetrical without gross deformities. Good, equal movement & strength bilaterally. Pulses:  Normal pulses noted. Extremities:  No clubbing or edema.  No cyanosis. Neurologic:  Alert and oriented x3;  grossly normal neurologically. Skin:  Intact without significant lesions or rashes. No jaundice. Lymph Nodes:  No significant cervical adenopathy. Psych:  Alert and cooperative. Normal mood and affect.   Labs: CBC    Component Value Date/Time   WBC 6.8 02/06/2018 1153   RBC 4.53 02/06/2018 1153   HGB 11.1 (L) 02/06/2018 1153   HGB 13.3 08/07/2015 1607   HCT 33.5 (L) 02/06/2018 1153   HCT 36.1 08/07/2015 1607   PLT 207 02/06/2018 1153   PLT 174 08/07/2015 1607   MCV 74.0 (L) 02/06/2018 1153   MCV 80 08/07/2015 1607   MCH 24.5 (L) 02/06/2018 1153   MCHC 33.1 02/06/2018 1153   RDW 16.8 (H) 02/06/2018 1153   RDW 14.2 08/07/2015 1607   LYMPHSABS 2,067 02/06/2018 1153   LYMPHSABS 1.8 08/07/2015 1607   MONOABS 368 01/01/2017 1607   EOSABS 27 02/06/2018 1153   EOSABS 0.0 02/27/2015 1015   BASOSABS 27 02/06/2018 1153   BASOSABS 0.0 02/27/2015 1015   CMP     Component Value Date/Time   NA 145 (H) 04/16/2018 1606   K 4.0 04/16/2018 1606   CL 112 (H) 04/16/2018 1606   CO2 21 04/16/2018 1606   GLUCOSE 84 04/16/2018 1606   GLUCOSE 88 02/06/2018 1153   BUN 8 04/16/2018 1606   CREATININE 0.78 04/16/2018 1606   CREATININE 0.63 02/06/2018 1153    CALCIUM 9.0 04/16/2018 1606   PROT 7.0 04/16/2018 1606   ALBUMIN 4.1 04/16/2018 1606   AST 14 04/16/2018 1606   AST 27 08/07/2015 1607   ALT 14 04/16/2018 1606   ALT 30 08/07/2015 1607   ALKPHOS 167 (H) 04/16/2018 1606   BILITOT <0.2 04/16/2018 1606   GFRNONAA 106 04/16/2018 1606   GFRNONAA 125 02/06/2018 1153   GFRAA 122 04/16/2018 1606   GFRAA 144 02/06/2018 1153    Imaging Studies: No results found.  Assessment and Plan:   Sabrina Mcgee is a 28 y.o. y/o female has been  referred for elevated alk phos  Patient is alk phos noted to be elevated to 139 on July 2019 labs Other liver enzymes completely normal No signs of cirrhosis Symptoms otherwise Patient denies any symptoms of pruritus which is a symptom seen and patients with PBC and elevated alk phos.  Therefore, no clinical evidence to suggest PBC at this time.  GGT was normal, but fractionation showed elevated liver fractionation for alk phos We will obtain further work-up to rule out any other sources of increased liver enzymes, including autoimmune and viral hepatitis  Ultrasound was done recently by primary care provider and showed a normal liver which is reassuring  I reviewed her medications and none of them are new, and I usually not known to cause elevated alk phos  We will follow-up in clinic to repeat labs in the near future and further work-up as needed   Dr Vonda Antigua  Speech recognition software was used to dictate the above note.

## 2018-04-19 LAB — COMPREHENSIVE METABOLIC PANEL
ALT: 14 IU/L (ref 0–32)
AST: 14 IU/L (ref 0–40)
Albumin/Globulin Ratio: 1.4 (ref 1.2–2.2)
Albumin: 4.1 g/dL (ref 3.5–5.5)
Alkaline Phosphatase: 167 IU/L — ABNORMAL HIGH (ref 39–117)
BUN/Creatinine Ratio: 10 (ref 9–23)
BUN: 8 mg/dL (ref 6–20)
Bilirubin Total: <0.2 mg/dL (ref 0.0–1.2)
CO2: 21 mmol/L (ref 20–29)
Calcium: 9 mg/dL (ref 8.7–10.2)
Chloride: 112 mmol/L — ABNORMAL HIGH (ref 96–106)
Creatinine, Ser: 0.78 mg/dL (ref 0.57–1.00)
GFR calc Af Amer: 122 mL/min/{1.73_m2} (ref >59)
GFR calc non Af Amer: 106 mL/min/{1.73_m2} (ref >59)
Globulin, Total: 2.9 g/dL (ref 1.5–4.5)
Glucose: 84 mg/dL (ref 65–99)
Potassium: 4 mmol/L (ref 3.5–5.2)
Sodium: 145 mmol/L — ABNORMAL HIGH (ref 134–144)
Total Protein: 7 g/dL (ref 6.0–8.5)

## 2018-04-19 LAB — HEPATITIS A ANTIBODY, TOTAL: Hep A Total Ab: NEGATIVE

## 2018-04-19 LAB — HCV RNA QUANT: Hepatitis C Quantitation: NOT DETECTED IU/mL

## 2018-04-19 LAB — HEPATITIS B CORE ANTIBODY, TOTAL: Hep B Core Total Ab: NEGATIVE

## 2018-04-19 LAB — MITOCHONDRIAL/SMOOTH MUSCLE AB PNL
Mitochondrial Ab: <20 Units (ref 0.0–20.0)
Smooth Muscle Ab: 10 Units (ref 0–19)

## 2018-04-19 LAB — HEPATITIS B CORE ANTIBODY, IGM: Hep B C IgM: NEGATIVE

## 2018-04-19 LAB — HEPATITIS B SURFACE ANTIBODY,QUALITATIVE: Hep B Surface Ab, Qual: NONREACTIVE

## 2018-04-19 LAB — ALPHA-1-ANTITRYPSIN: A-1 Antitrypsin: 161 mg/dL (ref 90–200)

## 2018-04-19 LAB — HEPATITIS A ANTIBODY, IGM: Hep A IgM: NEGATIVE

## 2018-04-19 LAB — HEPATITIS B SURFACE ANTIGEN: Hepatitis B Surface Ag: NEGATIVE

## 2018-05-08 ENCOUNTER — Encounter: Payer: Self-pay | Admitting: Family Medicine

## 2018-05-08 ENCOUNTER — Ambulatory Visit: Payer: 59 | Admitting: Family Medicine

## 2018-05-08 VITALS — BP 110/72 | HR 86 | Temp 98.7°F | Ht 67.0 in | Wt 253.1 lb

## 2018-05-08 DIAGNOSIS — R748 Abnormal levels of other serum enzymes: Secondary | ICD-10-CM | POA: Diagnosis not present

## 2018-05-08 DIAGNOSIS — D509 Iron deficiency anemia, unspecified: Secondary | ICD-10-CM | POA: Diagnosis not present

## 2018-05-08 DIAGNOSIS — F4010 Social phobia, unspecified: Secondary | ICD-10-CM

## 2018-05-08 DIAGNOSIS — G4700 Insomnia, unspecified: Secondary | ICD-10-CM

## 2018-05-08 DIAGNOSIS — N921 Excessive and frequent menstruation with irregular cycle: Secondary | ICD-10-CM

## 2018-05-08 DIAGNOSIS — Z23 Encounter for immunization: Secondary | ICD-10-CM | POA: Diagnosis not present

## 2018-05-08 DIAGNOSIS — E559 Vitamin D deficiency, unspecified: Secondary | ICD-10-CM | POA: Diagnosis not present

## 2018-05-08 NOTE — Progress Notes (Signed)
BP 110/72   Pulse 86   Temp 98.7 F (37.1 C) (Oral)   Ht 5\' 7"  (1.702 m)   Wt 253 lb 1.6 oz (114.8 kg)   LMP 03/28/2018   SpO2 98%   BMI 39.64 kg/m    Subjective:    Patient ID: Sabrina Mcgee, female    DOB: October 11, 1990, 27 y.o.   MRN: [id number]  HPI: Sabrina Mcgee is a 27 y.o. female  Chief Complaint  Patient presents with  . Follow-up    HPI Since last visit, URI last week; no fevers now, feeling 90-95% better  She feels like she is doing okay in terms of her mood She has "medium" problems with falling asleep; she will either watch a documentary or unsolved murders on TV or take melatonin; TV is in the bedroom, and is willing to move out Cut down on caffeine  Vitamin D deficiency; she has been taking 50k vitamin D Rx every week; no diarrhea; just ran out Reviewed last four labs; not much sun exposure  High liver enzymes; she saw the GI doctor; she did not find anything wrong  Anemia; taking iron daily; heavy periods, 1/2 ounces daily; periods are irregular; cramps and pain  Depression screen [facility] 2/9 05/08/2018 05/08/2018 02/05/2018 01/01/2017 09/05/2016  Decreased Interest 0 0 0 1 1  Down, Depressed, Hopeless 1 1 1 1 1   PHQ - 2 Score 1 1 1 2 2   Altered sleeping 1 - 1 1 1   Tired, decreased energy 1 - 3 1 1   Change in appetite 1 - 2 1 1   Feeling bad or failure about yourself  1 - 3 1 1   Trouble concentrating 1 - 1 0 0  Moving slowly or fidgety/restless 0 - 0 1 0  Suicidal thoughts 0 - 0 0 0  PHQ-9 Score 6 - 11 7 6   Difficult doing work/chores Somewhat difficult - Not difficult at all Not difficult at all Not difficult at all   Fall Risk  05/08/2018 02/05/2018 01/01/2017 03/22/2016 11/21/2015  Falls in the past year? No No No No No    Relevant past medical, surgical, family and social history reviewed Past Medical History:  Diagnosis Date  . Anemia   . Anxiety   . Depression   . Family history of adverse reaction to anesthesia    moher nausea and vomiting  .  Goiter   . Migraine   . Pituitary adenoma ([place])   . Vitamin D deficiency disease    Past Surgical History:  Procedure Laterality Date  . ROBOTIC ASSISTED LAPAROSCOPIC CHOLECYSTECTOMY N/A 10/10/2015   Procedure: ROBOTIC ASSISTED LAPAROSCOPIC CHOLECYSTECTOMY;  Surgeon: Clayburn Pert, MD;  Location: [facility] ORS;  Service: General;  Laterality: N/A;  . TONSILLECTOMY  2004   Family History  Problem Relation Age of Onset  . Heart disease Mother   . Hypertension Mother   . Cancer Father        kidney  . Heart disease Father   . Hypertension Father   . Cancer Paternal Aunt        breast  . Heart disease Maternal Grandmother   . Hypertension Maternal Grandmother   . Heart disease Maternal Grandfather   . Hypertension Maternal Grandfather   . Heart disease Paternal Grandmother   . Hypertension Paternal Grandmother   . COPD Paternal Grandmother   . Cancer Paternal Grandfather        prostate  . Heart disease Paternal Grandfather   . Hypertension Paternal Grandfather   .  Diabetes Paternal Aunt    Social History   Tobacco Use  . Smoking status: Never Smoker  . Smokeless tobacco: Never Used  Substance Use Topics  . Alcohol use: No  . Drug use: No     [facility] Visit from [facility]/08/2018 in [facility]  AUDIT-C Score  0      Interim medical history since last visit reviewed. Allergies and medications reviewed  Review of Systems Per HPI unless specifically indicated above     Objective:    BP 110/72   Pulse 86   Temp 98.7 F (37.1 C) (Oral)   Ht 5\' 7"  (1.702 m)   Wt 253 lb 1.6 oz (114.8 kg)   LMP 03/28/2018   SpO2 98%   BMI 39.64 kg/m   Wt Readings from Last 3 Encounters:  05/08/18 253 lb 1.6 oz (114.8 kg)  04/16/18 249 lb 12.8 oz (113.3 kg)  02/05/18 246 lb 6.4 oz (111.8 kg)    Physical Exam  Constitutional: She appears well-developed and well-nourished.  Obese; weight loss noted  HENT:  Mouth/Throat: Mucous membranes are normal.  Eyes: EOM are  normal. No scleral icterus.  Cardiovascular: Normal rate and regular rhythm.  Pulmonary/Chest: Effort normal and breath sounds normal.  Psychiatric: She has a normal mood and affect. Her behavior is normal.      Assessment & Plan:   Problem List Items Addressed This Visit      Other   Vitamin D deficiency - Primary    Completed course of Rx vitamin D; check level and supplmenet as needed      Relevant Orders   VITAMIN D 25 Hydroxy (Vit-D Deficiency, Fractures) (Completed)   Social anxiety disorder    Continue medicine      Microcytic anemia    Labs today      Relevant Orders   Iron, TIBC and Ferritin Panel (Completed)   CBC with Differential/Platelet (Completed)   Ambulatory referral to Obstetrics / Gynecology   Insomnia    Recommended that she NOT watch unsolved murders when trying to fall asleep; healthy sleep hygiene promoted       Other Visit Diagnoses    Alkaline phosphatase elevation       check additional labs; to GI if indicated   Relevant Orders   Alkaline Phosphatase Isoenzymes (Completed)   Menorrhagia with irregular cycle       refer to GYN   Relevant Orders   Ambulatory referral to Obstetrics / Gynecology   Need for influenza vaccination       Relevant Orders   Flu Vaccine QUAD 6+ mos PF IM (Fluarix Quad PF) (Completed)       Follow up plan: No follow-ups on file.  An after-visit summary was printed and given to the patient at [facility].  Please see the patient instructions which may contain other information and recommendations beyond what is mentioned above in the assessment and plan.  No orders of the defined types were placed in this encounter.   Orders Placed This Encounter  Procedures  . Flu Vaccine QUAD 6+ mos PF IM (Fluarix Quad PF)  . Alkaline Phosphatase Isoenzymes  . VITAMIN D 25 Hydroxy (Vit-D Deficiency, Fractures)  . Iron, TIBC and Ferritin Panel  . CBC with Differential/Platelet  . Ambulatory referral to Obstetrics /  Gynecology

## 2018-05-11 ENCOUNTER — Other Ambulatory Visit: Payer: Self-pay | Admitting: Family Medicine

## 2018-05-11 DIAGNOSIS — D509 Iron deficiency anemia, unspecified: Secondary | ICD-10-CM

## 2018-05-11 MED ORDER — VITAMIN D (ERGOCALCIFEROL) 1.25 MG (50000 UNIT) PO CAPS: 50000.0000 [IU] | ORAL_CAPSULE | ORAL | 2 refills | Status: AC

## 2018-05-11 NOTE — Progress Notes (Signed)
Rx vitamin D once a month for 3 months, then OTC in January 2020 Check CBC and iron panel in 6 weeks

## 2018-05-12 LAB — CBC WITH DIFFERENTIAL/PLATELET
Basophils Absolute: 19 cells/uL (ref 0–200)
Basophils Relative: 0.4 %
Eosinophils Absolute: 28 cells/uL (ref 15–500)
Eosinophils Relative: 0.6 %
HCT: 34.5 % — ABNORMAL LOW (ref 35.0–45.0)
Hemoglobin: 11.3 g/dL — ABNORMAL LOW (ref 11.7–15.5)
Lymphs Abs: 1880 cells/uL (ref 850–3900)
MCH: 23.8 pg — ABNORMAL LOW (ref 27.0–33.0)
MCHC: 32.8 g/dL (ref 32.0–36.0)
MCV: 72.6 fL — ABNORMAL LOW (ref 80.0–100.0)
Monocytes Relative: 6.6 %
Neutro Abs: 2463 cells/uL (ref 1500–7800)
Neutrophils Relative %: 52.4 %
Platelets: 221 10*3/uL (ref 140–400)
RBC: 4.75 10*6/uL (ref 3.80–5.10)
RDW: 16.7 % — ABNORMAL HIGH (ref 11.0–15.0)
Total Lymphocyte: 40 %
WBC mixed population: 310 cells/uL (ref 200–950)
WBC: 4.7 10*3/uL (ref 3.8–10.8)

## 2018-05-12 LAB — ALKALINE PHOSPHATASE ISOENZYMES
Alkaline phosphatase (APISO): 143 U/L — ABNORMAL HIGH (ref 33–115)
Bone Isoenzymes: 20 % — ABNORMAL LOW (ref 28–66)
Intestinal Isoenzymes: 0 % — ABNORMAL LOW (ref 1–24)
Liver Isoenzymes: 80 % — ABNORMAL HIGH (ref 25–69)

## 2018-05-12 LAB — VITAMIN D 25 HYDROXY (VIT D DEFICIENCY, FRACTURES): Vit D, 25-Hydroxy: 25 ng/mL — ABNORMAL LOW (ref 30–100)

## 2018-05-12 LAB — IRON,TIBC AND FERRITIN PANEL
%SAT: 19 % (calc) (ref 16–45)
Ferritin: 8 ng/mL — ABNORMAL LOW (ref 16–154)
Iron: 87 ug/dL (ref 40–190)
TIBC: 456 mcg/dL (calc) — ABNORMAL HIGH (ref 250–450)

## 2018-05-12 NOTE — Addendum Note (Signed)
Addended by: Vonda Antigua on: 05/12/2018 12:05 PM   Modules accepted: Orders

## 2018-05-15 ENCOUNTER — Other Ambulatory Visit: Payer: Self-pay

## 2018-05-15 DIAGNOSIS — R748 Abnormal levels of other serum enzymes: Secondary | ICD-10-CM

## 2018-05-18 DIAGNOSIS — G4700 Insomnia, unspecified: Secondary | ICD-10-CM | POA: Insufficient documentation

## 2018-05-18 NOTE — Assessment & Plan Note (Signed)
Labs today

## 2018-05-18 NOTE — Assessment & Plan Note (Signed)
Continue medicine 

## 2018-05-18 NOTE — Assessment & Plan Note (Signed)
Recommended that she NOT watch unsolved murders when trying to fall asleep; healthy sleep hygiene promoted

## 2018-05-18 NOTE — Assessment & Plan Note (Signed)
Completed course of Rx vitamin D; check level and supplmenet as needed

## 2018-05-20 ENCOUNTER — Other Ambulatory Visit
Admission: RE | Admit: 2018-05-20 | Discharge: 2018-05-20 | Disposition: A | Discharge status: Home / Self Care | Payer: 59 | Source: Ambulatory Visit | Attending: Gastroenterology | Admitting: Gastroenterology

## 2018-05-20 DIAGNOSIS — R748 Abnormal levels of other serum enzymes: Secondary | ICD-10-CM | POA: Insufficient documentation

## 2018-05-20 LAB — GAMMA GT: GGT: 13 U/L (ref 7–50)

## 2018-05-20 LAB — ALKALINE PHOSPHATASE: Alkaline Phosphatase: 135 U/L — ABNORMAL HIGH (ref 38–126)

## 2018-05-21 LAB — CERULOPLASMIN: Ceruloplasmin: 35.2 mg/dL (ref 19.0–39.0)

## 2018-05-22 ENCOUNTER — Ambulatory Visit (INDEPENDENT_AMBULATORY_CARE_PROVIDER_SITE_OTHER): Payer: 59

## 2018-05-22 DIAGNOSIS — Z1211 Encounter for screening for malignant neoplasm of colon: Secondary | ICD-10-CM | POA: Diagnosis not present

## 2018-05-22 DIAGNOSIS — D509 Iron deficiency anemia, unspecified: Secondary | ICD-10-CM

## 2018-05-22 LAB — POC HEMOCCULT BLD/STL (HOME/3-CARD/SCREEN)
Card #1 Date: 10292019
Card #2 Date: 10302019
Card #2 Fecal Occult Blod, POC: NEGATIVE
Card #3 Date: 10312019
Card #3 Fecal Occult Blood, POC: NEGATIVE
Fecal Occult Blood, POC: NEGATIVE

## 2018-05-28 ENCOUNTER — Encounter: Payer: Self-pay | Admitting: Family Medicine

## 2018-05-28 ENCOUNTER — Telehealth: Payer: Self-pay

## 2018-05-28 DIAGNOSIS — R748 Abnormal levels of other serum enzymes: Secondary | ICD-10-CM

## 2018-05-28 NOTE — Telephone Encounter (Signed)
-----  Message from Virgel Manifold, MD sent at 05/27/2018 12:12 PM EST ----- Jackelyn Poling please let patient know, her liver enzymes, her alk phos is improved and decreased to 135.  Her GGT is completely normal, indicating that the elevation in alk phos is not from her liver, and is from another source.  She should follow-up with Dr. Harlow Mares to see if she needs a bone scan.

## 2018-05-28 NOTE — Telephone Encounter (Signed)
Pt notified and to contact Dr. Delight Ovens office for possible bone scan.

## 2018-05-28 NOTE — Telephone Encounter (Signed)
I've ordered the NM whole body bone scan Please let patient know how to get that scheduled

## 2018-06-01 DIAGNOSIS — N912 Amenorrhea, unspecified: Secondary | ICD-10-CM | POA: Diagnosis not present

## 2018-06-01 DIAGNOSIS — N921 Excessive and frequent menstruation with irregular cycle: Secondary | ICD-10-CM | POA: Diagnosis not present

## 2018-06-01 DIAGNOSIS — R635 Abnormal weight gain: Secondary | ICD-10-CM | POA: Diagnosis not present

## 2018-06-10 DIAGNOSIS — N9489 Other specified conditions associated with female genital organs and menstrual cycle: Secondary | ICD-10-CM | POA: Diagnosis not present

## 2018-06-10 DIAGNOSIS — D509 Iron deficiency anemia, unspecified: Secondary | ICD-10-CM | POA: Diagnosis not present

## 2018-06-10 DIAGNOSIS — N921 Excessive and frequent menstruation with irregular cycle: Secondary | ICD-10-CM | POA: Diagnosis not present

## 2018-06-15 ENCOUNTER — Encounter
Admission: RE | Admit: 2018-06-15 | Discharge: 2018-06-15 | Disposition: A | Discharge status: Home / Self Care | Payer: 59 | Source: Ambulatory Visit | Attending: Obstetrics & Gynecology | Admitting: Obstetrics & Gynecology

## 2018-06-15 ENCOUNTER — Encounter: Payer: Self-pay | Admitting: Family Medicine

## 2018-06-15 ENCOUNTER — Other Ambulatory Visit: Payer: Self-pay

## 2018-06-15 NOTE — Patient Instructions (Signed)
Your procedure is scheduled on: 06-22-18 Report to Same Day Surgery 2nd floor medical mall [facility] Entrance-take elevator on left to 2nd floor.  Check in with surgery information desk.) To find out your arrival time please call [phone] between 1PM - 3PM on 06-19-18  Remember: Instructions that are not followed completely may result in serious medical risk, up to and including death, or upon the discretion of your surgeon and anesthesiologist your surgery may need to be rescheduled.    _x___ 1. Do not eat food after midnight the night before your procedure. You may drink clear liquids up to 2 hours before you are scheduled to arrive at the hospital for your procedure.  Do not drink clear liquids within 2 hours of your scheduled arrival to the hospital.  Clear liquids include  --Water or Apple juice without pulp  --Clear carbohydrate beverage such as ClearFast or Gatorade  --Black Coffee or Clear Tea (No milk, no creamers, do not add anything to  the coffee or Tea   ____Ensure clear carbohydrate drink on the way to the hospital for bariatric patients  ____Ensure clear carbohydrate drink 3 hours before surgery for Dr Dwyane Luo patients if physician instructed.   No gum chewing or hard candies.     __x__ 2. No Alcohol for 24 hours before or after surgery.   __x__3. No Smoking or e-cigarettes for 24 prior to surgery.  Do not use any chewable tobacco products for at least 6 hour prior to surgery   ____  4. Bring all medications with you on the day of surgery if instructed.    __x__ 5. Notify your doctor if there is any change in your medical condition     (cold, fever, infections).    x___6. On the morning of surgery brush your teeth with toothpaste and water.  You may rinse your mouth with mouth wash if you wish.  Do not swallow any toothpaste or mouthwash.   Do not wear jewelry, make-up, hairpins, clips or nail polish.  Do not wear lotions, powders, or perfumes. You may wear  deodorant.  Do not shave 48 hours prior to surgery. Men may shave face and neck.  Do not bring valuables to the hospital.    [facility] is not responsible for any belongings or valuables.               Contacts, dentures or bridgework may not be worn into surgery.  Leave your suitcase in the car. After surgery it may be brought to your room.  For patients admitted to the hospital, discharge time is determined by your treatment team.  _  Patients discharged the day of surgery will not be allowed to drive home.  You will need someone to drive you home and stay with you the night of your procedure.    Please read over the following fact sheets that you were given:   [facility] Preparing for Surgery   ____ Take anti-hypertensive listed below, cardiac, seizure, asthma, anti-reflux and psychiatric medicines. These include:  1. NONE  2.  3.  4.  5.  6.  ____Fleets enema or Magnesium Citrate as directed.   ____ Use CHG Soap or sage wipes as directed on instruction sheet   ____ Use inhalers on the day of surgery and bring to hospital day of surgery  ____ Stop Metformin and Janumet 2 days prior to surgery.    ____ Take 1/2 of usual insulin dose the night before surgery and none  on the morning surgery.   ____ Follow recommendations from Cardiologist, Pulmonologist or PCP regarding stopping Aspirin, Coumadin, Plavix ,Eliquis, Effient, or Pradaxa, and Pletal.  X____Stop Anti-inflammatories such as Advil, Aleve, Ibuprofen, Motrin, Naproxen, Naprosyn, Goodies powders or aspirin products NOW-OK to take Tylenol    ____ Stop supplements until after surgery.    ____ Bring C-Pap to the hospital.

## 2018-06-22 ENCOUNTER — Ambulatory Visit: Payer: 59 | Admitting: Anesthesiology

## 2018-06-22 ENCOUNTER — Encounter
Admission: RE | Disposition: A | Discharge status: Home / Self Care | Payer: Self-pay | Source: Ambulatory Visit | Attending: Obstetrics & Gynecology

## 2018-06-22 ENCOUNTER — Other Ambulatory Visit: Payer: Self-pay

## 2018-06-22 ENCOUNTER — Ambulatory Visit
Admission: RE | Admit: 2018-06-22 | Discharge: 2018-06-22 | Disposition: A | Discharge status: Home / Self Care | Payer: 59 | Source: Ambulatory Visit | Attending: Obstetrics & Gynecology | Admitting: Obstetrics & Gynecology

## 2018-06-22 DIAGNOSIS — E119 Type 2 diabetes mellitus without complications: Secondary | ICD-10-CM | POA: Diagnosis not present

## 2018-06-22 DIAGNOSIS — E669 Obesity, unspecified: Secondary | ICD-10-CM | POA: Diagnosis not present

## 2018-06-22 DIAGNOSIS — N8500 Endometrial hyperplasia, unspecified: Secondary | ICD-10-CM | POA: Diagnosis not present

## 2018-06-22 DIAGNOSIS — F419 Anxiety disorder, unspecified: Secondary | ICD-10-CM | POA: Diagnosis not present

## 2018-06-22 DIAGNOSIS — N921 Excessive and frequent menstruation with irregular cycle: Secondary | ICD-10-CM | POA: Insufficient documentation

## 2018-06-22 DIAGNOSIS — G43909 Migraine, unspecified, not intractable, without status migrainosus: Secondary | ICD-10-CM | POA: Diagnosis not present

## 2018-06-22 DIAGNOSIS — R9389 Abnormal findings on diagnostic imaging of other specified body structures: Secondary | ICD-10-CM | POA: Insufficient documentation

## 2018-06-22 DIAGNOSIS — F329 Major depressive disorder, single episode, unspecified: Secondary | ICD-10-CM | POA: Insufficient documentation

## 2018-06-22 DIAGNOSIS — Z6838 Body mass index (BMI) 38.0-38.9, adult: Secondary | ICD-10-CM | POA: Diagnosis not present

## 2018-06-22 DIAGNOSIS — Z7689 Persons encountering health services in other specified circumstances: Secondary | ICD-10-CM | POA: Diagnosis not present

## 2018-06-22 DIAGNOSIS — N858 Other specified noninflammatory disorders of uterus: Secondary | ICD-10-CM | POA: Diagnosis not present

## 2018-06-22 HISTORY — PX: HYSTEROSCOPY WITH D & C: SHX1775

## 2018-06-22 LAB — BASIC METABOLIC PANEL
Anion gap: 8 (ref 5–15)
BUN: 10 mg/dL (ref 6–20)
CO2: 21 mmol/L — ABNORMAL LOW (ref 22–32)
Calcium: 8.6 mg/dL — ABNORMAL LOW (ref 8.9–10.3)
Chloride: 111 mmol/L (ref 98–111)
Creatinine, Ser: 0.63 mg/dL (ref 0.44–1.00)
GFR calc Af Amer: >60 mL/min (ref >60)
GFR calc non Af Amer: >60 mL/min (ref >60)
Glucose, Bld: 97 mg/dL (ref 70–99)
Potassium: 3.5 mmol/L (ref 3.5–5.1)
Sodium: 140 mmol/L (ref 135–145)

## 2018-06-22 LAB — CBC
HCT: 30 % — ABNORMAL LOW (ref 36.0–46.0)
Hemoglobin: 10 g/dL — ABNORMAL LOW (ref 12.0–15.0)
MCH: 24.2 pg — ABNORMAL LOW (ref 26.0–34.0)
MCHC: 33.3 g/dL (ref 30.0–36.0)
MCV: 72.6 fL — ABNORMAL LOW (ref 80.0–100.0)
Platelets: 192 10*3/uL (ref 150–400)
RBC: 4.13 MIL/uL (ref 3.87–5.11)
RDW: 16.8 % — ABNORMAL HIGH (ref 11.5–15.5)
WBC: 4.9 10*3/uL (ref 4.0–10.5)
nRBC: 0 % (ref 0.0–0.2)

## 2018-06-22 LAB — POCT PREGNANCY, URINE: Preg Test, Ur: NEGATIVE

## 2018-06-22 LAB — ABO/RH: ABO/RH(D): O POS

## 2018-06-22 LAB — TYPE AND SCREEN
ABO/RH(D): O POS
Antibody Screen: NEGATIVE

## 2018-06-22 SURGERY — DILATATION AND CURETTAGE /HYSTEROSCOPY
Anesthesia: General

## 2018-06-22 MED ORDER — LIDOCAINE HCL (CARDIAC) PF 100 MG/5ML IV SOSY
PREFILLED_SYRINGE | INTRAVENOUS | Status: DC | PRN
  Administered 2018-06-22: 60 mg via INTRAVENOUS

## 2018-06-22 MED ORDER — FAMOTIDINE 20 MG PO TABS
ORAL_TABLET | ORAL | Status: AC
  Filled 2018-06-22: qty 1

## 2018-06-22 MED ORDER — ACETAMINOPHEN 325 MG PO TABS: 650.0000 mg | ORAL_TABLET | ORAL | Status: DC | PRN

## 2018-06-22 MED ORDER — ONDANSETRON HCL 4 MG/2ML IJ SOLN
INTRAMUSCULAR | Status: AC
  Filled 2018-06-22: qty 2

## 2018-06-22 MED ORDER — SEVOFLURANE IN SOLN
RESPIRATORY_TRACT | Status: AC
  Filled 2018-06-22: qty 250

## 2018-06-22 MED ORDER — MIDAZOLAM HCL 2 MG/2ML IJ SOLN
INTRAMUSCULAR | Status: DC | PRN
  Administered 2018-06-22: 2 mg via INTRAVENOUS

## 2018-06-22 MED ORDER — FENTANYL CITRATE (PF) 100 MCG/2ML IJ SOLN: 25.0000 ug | INTRAMUSCULAR | Status: DC | PRN

## 2018-06-22 MED ORDER — MEPERIDINE HCL 50 MG/ML IJ SOLN: 6.2500 mg | INTRAMUSCULAR | Status: DC | PRN

## 2018-06-22 MED ORDER — MORPHINE SULFATE (PF) 4 MG/ML IV SOLN: 1.0000 mg | INTRAVENOUS | Status: DC | PRN

## 2018-06-22 MED ORDER — MIDAZOLAM HCL 2 MG/2ML IJ SOLN
INTRAMUSCULAR | Status: AC
  Filled 2018-06-22: qty 2

## 2018-06-22 MED ORDER — DEXAMETHASONE SODIUM PHOSPHATE 10 MG/ML IJ SOLN
INTRAMUSCULAR | Status: DC | PRN
  Administered 2018-06-22: 10 mg via INTRAVENOUS

## 2018-06-22 MED ORDER — ACETAMINOPHEN 10 MG/ML IV SOLN
INTRAVENOUS | Status: DC | PRN
  Administered 2018-06-22: 1000 mg via INTRAVENOUS

## 2018-06-22 MED ORDER — OXYCODONE HCL 5 MG PO TABS: 5.0000 mg | ORAL_TABLET | Freq: Once | ORAL | Status: DC | PRN

## 2018-06-22 MED ORDER — ACETAMINOPHEN 650 MG RE SUPP
650.0000 mg | RECTAL | Status: DC | PRN
  Filled 2018-06-22: qty 1

## 2018-06-22 MED ORDER — FENTANYL CITRATE (PF) 100 MCG/2ML IJ SOLN
INTRAMUSCULAR | Status: AC
  Filled 2018-06-22: qty 2

## 2018-06-22 MED ORDER — KETOROLAC TROMETHAMINE 30 MG/ML IJ SOLN
30.0000 mg | Freq: Four times a day (QID) | INTRAMUSCULAR | Status: DC
  Filled 2018-06-22: qty 1

## 2018-06-22 MED ORDER — KETOROLAC TROMETHAMINE 30 MG/ML IJ SOLN
INTRAMUSCULAR | Status: AC
  Filled 2018-06-22: qty 1

## 2018-06-22 MED ORDER — LIDOCAINE HCL URETHRAL/MUCOSAL 2 % EX GEL
CUTANEOUS | Status: AC
  Filled 2018-06-22: qty 5

## 2018-06-22 MED ORDER — ACETAMINOPHEN 10 MG/ML IV SOLN
INTRAVENOUS | Status: AC
  Filled 2018-06-22: qty 100

## 2018-06-22 MED ORDER — LACTATED RINGERS IV SOLN
INTRAVENOUS | Status: DC
  Administered 2018-06-22 (×2): via INTRAVENOUS

## 2018-06-22 MED ORDER — PROPOFOL 10 MG/ML IV BOLUS
INTRAVENOUS | Status: DC | PRN
  Administered 2018-06-22: 180 mg via INTRAVENOUS

## 2018-06-22 MED ORDER — LACTATED RINGERS IV SOLN: INTRAVENOUS | Status: DC

## 2018-06-22 MED ORDER — PROPOFOL 10 MG/ML IV BOLUS
INTRAVENOUS | Status: AC
  Filled 2018-06-22: qty 20

## 2018-06-22 MED ORDER — PROMETHAZINE HCL 25 MG/ML IJ SOLN: 6.2500 mg | INTRAMUSCULAR | Status: DC | PRN

## 2018-06-22 MED ORDER — EPHEDRINE SULFATE 50 MG/ML IJ SOLN
INTRAMUSCULAR | Status: AC
  Filled 2018-06-22: qty 1

## 2018-06-22 MED ORDER — SILVER NITRATE-POT NITRATE 75-25 % EX MISC
CUTANEOUS | Status: AC
  Filled 2018-06-22: qty 2

## 2018-06-22 MED ORDER — FAMOTIDINE 20 MG PO TABS
20.0000 mg | ORAL_TABLET | Freq: Once | ORAL | Status: AC
  Administered 2018-06-22: 20 mg via ORAL

## 2018-06-22 MED ORDER — OXYCODONE HCL 5 MG/5ML PO SOLN: 5.0000 mg | Freq: Once | ORAL | Status: DC | PRN

## 2018-06-22 MED ORDER — FENTANYL CITRATE (PF) 100 MCG/2ML IJ SOLN
INTRAMUSCULAR | Status: DC | PRN
  Administered 2018-06-22 (×4): 25 ug via INTRAVENOUS

## 2018-06-22 MED ORDER — KETOROLAC TROMETHAMINE 30 MG/ML IJ SOLN
INTRAMUSCULAR | Status: DC | PRN
  Administered 2018-06-22: 30 mg via INTRAVENOUS

## 2018-06-22 SURGICAL SUPPLY — 20 items
CATH ROBINSON RED A/P 16FR (CATHETERS) ×2 IMPLANT
COVER WAND RF STERILE (DRAPES) ×2 IMPLANT
DEVICE MYOSURE LITE (MISCELLANEOUS) IMPLANT
DEVICE MYOSURE REACH (MISCELLANEOUS) IMPLANT
ELECT REM PT RETURN 9FT ADLT (ELECTROSURGICAL) ×2
ELECTRODE REM PT RTRN 9FT ADLT (ELECTROSURGICAL) ×1 IMPLANT
GLOVE PI ORTHOPRO 6.5 (GLOVE) ×1
GLOVE PI ORTHOPRO STRL 6.5 (GLOVE) ×1 IMPLANT
GLOVE SURG SYN 6.5 ES PF (GLOVE) ×4 IMPLANT
GOWN STRL REUS W/ TWL LRG LVL3 (GOWN DISPOSABLE) ×2 IMPLANT
GOWN STRL REUS W/TWL LRG LVL3 (GOWN DISPOSABLE) ×2
KIT PROCEDURE FLUENT (KITS) ×2 IMPLANT
KIT TURNOVER CYSTO (KITS) ×2 IMPLANT
PACK DNC HYST (MISCELLANEOUS) ×2 IMPLANT
PAD OB MATERNITY 4.3X12.25 (PERSONAL CARE ITEMS) ×2 IMPLANT
PAD PREP 24X41 OB/GYN DISP (PERSONAL CARE ITEMS) ×2 IMPLANT
SEAL ROD LENS SCOPE MYOSURE (ABLATOR) ×2 IMPLANT
SOL .9 NS 3000ML IRR  AL (IV SOLUTION) ×1
SOL .9 NS 3000ML IRR UROMATIC (IV SOLUTION) ×1 IMPLANT
TUBING CONNECTING 10 (TUBING) ×2 IMPLANT

## 2018-06-22 NOTE — Discharge Instructions (Addendum)
You should expect to have some cramping and vaginal bleeding for about a week. This should taper off and subside, much like a period. If heavy bleeding continues or gets worse, you should contact the office for an earlier appointment.   Please call the office or physician on call for fever >101, severe pain, and heavy bleeding.   [phone]  NOTHING IN THE VAGINA FOR 2 WEEKS!!  Dr. Ward will discuss pathology results with you at your postop visit.    AMBULATORY SURGERY  DISCHARGE INSTRUCTIONS   1) The drugs that you were given will stay in your system until tomorrow so for the next 24 hours you should not:  A) Drive an automobile B) Make any legal decisions C) Drink any alcoholic beverage   2) You may resume regular meals tomorrow.  Today it is better to start with liquids and gradually work up to solid foods.  You may eat anything you prefer, but it is better to start with liquids, then soup and crackers, and gradually work up to solid foods.   3) Please notify your doctor immediately if you have any unusual bleeding, trouble breathing, redness and pain at the surgery site, drainage, fever, or pain not relieved by medication.    4) Additional Instructions:        Please contact your physician with any problems or Same Day Surgery at [phone], Monday through Friday 6 am to 4 pm, or [place] [facility] [place] Main number at [phone].  

## 2018-06-22 NOTE — Anesthesia Post-op Follow-up Note (Signed)
Anesthesia QCDR form completed.        

## 2018-06-22 NOTE — Op Note (Signed)
Operative Report Hysteroscopy, Dilation and Curettage 06/22/2018  Patient:  Sabrina Mcgee  27 y.o. female Preoperative diagnosis:  Menometrorrhagia, endometrial mass Postoperative diagnosis:  endometrial thickening  PROCEDURE:  Procedure(s): DILATATION AND CURETTAGE /HYSTEROSCOPY (N/A) Surgeon:  Surgeon(s) and Role:    * Peityn Payton, Honor Loh, MD - Primary Anesthesia:  LMA I/O: Total I/O In: 600 [I.V.:600] Out: - minimal Specimens:  Endometrial curettings Complications: None Apparent Disposition:  VS stable to PACU  Findings: Uterus, mobile, normal size, sounding to 8.5cm; normal cervix, vagina, perineum.  Hysteroscopic findings:  Patent bilateral ostea, thickened endometrium without polyp or mass  Indication for procedure/Consents: 27 y.o. G0 with heavy uterine bleeding, and 2.5cm endometrial lining on ultrasound.  Risks of surgery were discussed with the patient including but not limited to: bleeding which may require transfusion; infection which may require antibiotics; injury to uterus or surrounding organs; intrauterine scarring which may impair future fertility; need for additional procedures including laparotomy or laparoscopy; and other postoperative/anesthesia complications. Written informed consent was obtained.    Procedure Details:   The patient was then taken to the operating room where anesthesia was administered and was found to be adequate.  After a formal timeout was performed, she was placed in the dorsal lithotomy position and examined with the above findings. She was then prepped and draped in the sterile manner.  A speculum was then placed in the patient's vagina and a single tooth tenaculum was applied to the anterior lip of the cervix.    The uterus was sounded to 8.5cm. Her cervix was serially dilated to accommodate the myoscope, with findings as above. A sharp curettage was then performed until there was a gritty texture in all four quadrants. The specimen was handed  off to nursing.  The camera was reinserted and confirmed the uterus had been evacuated. The tenaculum was removed from the anterior lip of the cervix and the vaginal speculum was removed after noting good hemostasis. The patient tolerated the procedure well and was taken to the recovery area awake, extubated and in stable condition.  The patient will be discharged to home as per PACU criteria.  Routine postoperative instructions given. She will follow up in the clinic in two to four weeks for postoperative evaluation.  Larey Days, MD [facility] OBGYN Attending Gynecologist

## 2018-06-22 NOTE — Transfer of Care (Signed)
Immediate Anesthesia Transfer of Care Note  Patient: Sabrina Mcgee  Procedure(s) Performed: DILATATION AND CURETTAGE /HYSTEROSCOPY (N/A )  Patient Location: PACU  Anesthesia Type:General  Level of Consciousness: awake and alert   Airway & Oxygen Therapy: Patient Spontanous Breathing and Patient connected to face mask oxygen  Post-op Assessment: Report given to RN and Post -op Vital signs reviewed and stable  Post vital signs: Reviewed and stable  Last Vitals:  Vitals Value Taken Time  BP 97/54 06/22/2018  1:27 PM  Temp 37.2 C 06/22/2018  1:27 PM  Pulse 93 06/22/2018  1:30 PM  Resp 0 06/22/2018  1:30 PM  SpO2 100 % 06/22/2018  1:30 PM  Vitals shown include unvalidated device data.  Last Pain:  Vitals:   06/22/18 1327  TempSrc:   PainSc: Asleep         Complications: No apparent anesthesia complications

## 2018-06-22 NOTE — Anesthesia Preprocedure Evaluation (Signed)
Anesthesia Evaluation  Patient identified by MRN, date of birth, ID band Patient awake    Reviewed: Allergy & Precautions, NPO status , Patient's Chart, lab work & pertinent test results  History of Anesthesia Complications Negative for: history of anesthetic complications  Airway Mallampati: I  TM Distance: >3 FB Neck ROM: Full    Dental no notable dental hx.    Pulmonary neg pulmonary ROS, neg sleep apnea, neg COPD,    breath sounds clear to auscultation- rhonchi (-) wheezing      Cardiovascular Exercise Tolerance: Good (-) hypertension(-) CAD, (-) Past MI, (-) Cardiac Stents and (-) CABG  Rhythm:Regular Rate:Normal - Systolic murmurs and - Diastolic murmurs    Neuro/Psych  Headaches, PSYCHIATRIC DISORDERS Anxiety Depression    GI/Hepatic negative GI ROS, Neg liver ROS,   Endo/Other  negative endocrine ROSneg diabetes  Renal/GU negative Renal ROS     Musculoskeletal negative musculoskeletal ROS (+)   Abdominal (+) + obese,   Peds  Hematology  (+) anemia ,   Anesthesia Other Findings Past Medical History: No date: Anemia No date: Anxiety No date: Depression No date: Family history of adverse reaction to anesthesia     Comment:  moher nausea and vomiting No date: Goiter No date: Migraine No date: Pituitary adenoma ([place]) No date: Vitamin D deficiency disease   Reproductive/Obstetrics                             Anesthesia Physical Anesthesia Plan  ASA: II  Anesthesia Plan: General   Post-op Pain Management:    Induction: Intravenous  PONV Risk Score and Plan: 2 and Dexamethasone, Ondansetron and Midazolam  Airway Management Planned: LMA  Additional Equipment:   Intra-op Plan:   Post-operative Plan:   Informed Consent: I have reviewed the patients History and Physical, chart, labs and discussed the procedure including the risks, benefits and alternatives for the  proposed anesthesia with the patient or authorized representative who has indicated his/her understanding and acceptance.   Dental advisory given  Plan Discussed with: CRNA and Anesthesiologist  Anesthesia Plan Comments:         Anesthesia Quick Evaluation

## 2018-06-22 NOTE — Anesthesia Procedure Notes (Signed)
Procedure Name: LMA Insertion Date/Time: 06/22/2018 12:15 PM Performed by: Allean Found, CRNA Pre-anesthesia Checklist: Patient identified, Patient being monitored, Timeout performed, Emergency Drugs available and Suction available Patient Re-evaluated:Patient Re-evaluated prior to induction Oxygen Delivery Method: Circle system utilized Preoxygenation: Pre-oxygenation with 100% oxygen Induction Type: IV induction Ventilation: Mask ventilation without difficulty LMA: LMA inserted LMA Size: 4.0 Number of attempts: 1 Placement Confirmation: positive ETCO2 and breath sounds checked- equal and bilateral Tube secured with: Tape Dental Injury: Teeth and Oropharynx as per pre-operative assessment

## 2018-06-22 NOTE — H&P (Signed)
Preoperative History and Physical  Sabrina Mcgee is a 27 y.o. here for surgical management of menometrorrhagia, with endometrial mass found on ultrasound.   No significant preoperative concerns.  Proposed surgery: dilation and curettage, hysteroscopy   Past Medical History:  Diagnosis Date  . Anemia   . Anxiety   . Depression   . Family history of adverse reaction to anesthesia    moher nausea and vomiting  . Goiter   . Migraine   . Pituitary adenoma ([place])   . Vitamin D deficiency disease    Past Surgical History:  Procedure Laterality Date  . ROBOTIC ASSISTED LAPAROSCOPIC CHOLECYSTECTOMY N/A 10/10/2015   Procedure: ROBOTIC ASSISTED LAPAROSCOPIC CHOLECYSTECTOMY;  Surgeon: Clayburn Pert, MD;  Location: [facility] ORS;  Service: General;  Laterality: N/A;  . TONSILLECTOMY  2004   OB History  No data available  Patient denies any other pertinent gynecologic issues.   No current facility-administered medications on file prior to encounter.    Current Outpatient Medications on File Prior to Encounter  Medication Sig Dispense Refill  . Butalbital-Acetaminophen 25-325 MG TABS Take 1 tablet by mouth daily as needed (headache).     . escitalopram (LEXAPRO) 20 MG tablet Take 1 tablet (20 mg total) by mouth daily. (Patient taking differently: Take 20 mg by mouth at bedtime. ) 30 tablet 11  . Ferrous Sulfate (IRON) 325 (65 Fe) MG TABS Take 325 mg by mouth daily.     . Multiple Vitamin (MULTIVITAMIN) tablet Take 1 tablet by mouth every evening.     . ondansetron (ZOFRAN ODT) 4 MG disintegrating tablet Take 1 tablet (4 mg total) by mouth every 8 (eight) hours as needed. 20 tablet 11  . Vitamin D, Ergocalciferol, (DRISDOL) 50000 units CAPS capsule Take 1 capsule (50,000 Units total) by mouth every 30 (thirty) days. (Patient taking differently: Take 50,000 Units by mouth every 7 (seven) days. ) 1 capsule 2  . rizatriptan (MAXALT-MLT) 10 MG disintegrating tablet Take 1 tablet (10 mg total) by mouth  as needed. May repeat in 2 hours if needed (Patient not taking: Reported on 06/12/2018) 15 tablet 11   Allergies  Allergen Reactions  . Shellfish Allergy Nausea Only and Rash    Migraines    Social History:   reports that she has never smoked. She has never used smokeless tobacco. She reports that she does not drink alcohol or use drugs.  Family History  Problem Relation Age of Onset  . Heart disease Mother   . Hypertension Mother   . Cancer Father        kidney  . Heart disease Father   . Hypertension Father   . Cancer Paternal Aunt        breast  . Heart disease Maternal Grandmother   . Hypertension Maternal Grandmother   . Heart disease Maternal Grandfather   . Hypertension Maternal Grandfather   . Heart disease Paternal Grandmother   . Hypertension Paternal Grandmother   . COPD Paternal Grandmother   . Cancer Paternal Grandfather        prostate  . Heart disease Paternal Grandfather   . Hypertension Paternal Grandfather   . Diabetes Paternal Aunt     Review of Systems: Noncontributory  PHYSICAL EXAM: Blood pressure 128/83, pulse 100, temperature 97.8 F (36.6 C), temperature source Tympanic, resp. rate 20, height 5\' 8"  (1.727 m), weight 113.4 kg, last menstrual period 06/15/2018, SpO2 99 %. General appearance - alert, well appearing, and in no distress Chest - clear to auscultation,  no wheezes, rales or rhonchi, symmetric air entry Heart - normal rate and regular rhythm Abdomen - soft, nontender, nondistended, no masses or organomegaly Pelvic - examination not indicated Extremities - peripheral pulses normal, no pedal edema, no clubbing or cyanosis  Labs: Results for orders placed or performed during the hospital encounter of 06/22/18 (from the past 336 hour(s))  CBC   Collection Time: 06/22/18 10:27 AM  Result Value Ref Range   WBC 4.9 4.0 - 10.5 K/uL   RBC 4.13 3.87 - 5.11 MIL/uL   Hemoglobin 10.0 (L) 12.0 - 15.0 g/dL   HCT 30.0 (L) 36.0 - 46.0 %   MCV  72.6 (L) 80.0 - 100.0 fL   MCH 24.2 (L) 26.0 - 34.0 pg   MCHC 33.3 30.0 - 36.0 g/dL   RDW 16.8 (H) 11.5 - 15.5 %   Platelets 192 150 - 400 K/uL   nRBC 0.0 0.0 - 0.2 %  Basic metabolic panel   Collection Time: 06/22/18 10:27 AM  Result Value Ref Range   Sodium 140 135 - 145 mmol/L   Potassium 3.5 3.5 - 5.1 mmol/L   Chloride 111 98 - 111 mmol/L   CO2 21 (L) 22 - 32 mmol/L   Glucose, Bld 97 70 - 99 mg/dL   BUN 10 6 - 20 mg/dL   Creatinine, Ser 0.63 0.44 - 1.00 mg/dL   Calcium 8.6 (L) 8.9 - 10.3 mg/dL   GFR calc non Af Amer >60 >60 mL/min   GFR calc Af Amer >60 >60 mL/min   Anion gap 8 5 - 15  Type and screen [place]   Collection Time: 06/22/18 10:27 AM  Result Value Ref Range   ABO/RH(D) PENDING    Antibody Screen PENDING    Sample Expiration      06/25/2018 Performed at [facility], [address] [place], [place] [address]   ABO/Rh   Collection Time: 06/22/18 10:40 AM  Result Value Ref Range   ABO/RH(D)      Jenetta Downer POS Performed at [facility], [address] [place],  [address]   Pregnancy, urine POC   Collection Time: 06/22/18 10:46 AM  Result Value Ref Range   Preg Test, Ur NEGATIVE NEGATIVE    Imaging Studies: No results found.  Assessment: Patient Active Problem List   Diagnosis Date Noted  . Insomnia 05/18/2018  . Migraine headache with aura 02/05/2018  . Chronic migraine 04/30/2017  . Weight gain 04/30/2017  . Nystagmus 03/12/2017  . Headache 03/12/2017  . Obesity (BMI 35.0-39.9 without comorbidity) 01/01/2017  . Headache, chronic daily 03/22/2016  . Microcytic anemia 02/28/2015  . Vitamin D deficiency 02/28/2015  . Social anxiety disorder 02/27/2015  . Depression 02/27/2015  . Allergic rhinitis 02/27/2015    Plan: Patient will undergo surgical management with D&C hysteroscopy.   The risks of surgery were discussed in detail with the patient including but not limited to: bleeding which may require  transfusion or reoperation; infection which may require antibiotics; injury to surrounding organs which may involve bowel, bladder, ureters ; need for additional procedures including laparoscopy or laparotomy; thromboembolic phenomenon, surgical site problems and other postoperative/anesthesia complications. Likelihood of success in alleviating the patient's condition was discussed. Routine postoperative instructions will be reviewed with the patient and her family in detail after surgery.  The patient concurred with the proposed plan, giving informed written consent for the surgery.  Patient has been NPO since last night she will remain NPO for procedure.  Anesthesia and OR aware.  To OR when ready.  ----- Larey Days, MD Attending Obstetrician and Gynecologist [facility], Department of [facility]

## 2018-06-23 ENCOUNTER — Encounter: Payer: Self-pay | Admitting: Obstetrics & Gynecology

## 2018-06-23 LAB — SURGICAL PATHOLOGY

## 2018-06-23 NOTE — Anesthesia Postprocedure Evaluation (Signed)
Anesthesia Post Note  Patient: Sabrina Mcgee  Procedure(s) Performed: DILATATION AND CURETTAGE /HYSTEROSCOPY (N/A )  Patient location during evaluation: PACU Anesthesia Type: General Level of consciousness: awake and alert and oriented Pain management: pain level controlled Vital Signs Assessment: post-procedure vital signs reviewed and stable Respiratory status: spontaneous breathing, nonlabored ventilation and respiratory function stable Cardiovascular status: blood pressure returned to baseline and stable Postop Assessment: no signs of nausea or vomiting Anesthetic complications: no     Last Vitals:  Vitals:   06/22/18 1416 06/22/18 1442  BP: 117/75 120/68  Pulse: 72 73  Resp: 14   Temp: (!) 36.1 C   SpO2: 99% 100%    Last Pain:  Vitals:   06/22/18 1442  TempSrc:   PainSc: 0-No pain                 Reinhart Saulters

## 2018-06-24 ENCOUNTER — Telehealth: Payer: Self-pay | Admitting: Family Medicine

## 2018-06-24 NOTE — Telephone Encounter (Signed)
Copied from [place] [id number]. Topic: Quick Communication - See Telephone Encounter >> Jun 24, 2018  9:16 AM Nils Flack wrote: CRM for notification. See Telephone encounter for: 06/24/18. Insurance denied test for NM test, stating that it is not medically necessary. Faxing letter to the office.

## 2018-06-24 NOTE — Telephone Encounter (Signed)
Please APPEAL This was recommended by the GI specialist  See her note: Notes recorded by Virgel Manifold, MD on 05/27/2018 at 12:12 PM EST Debbie please let patient know, her liver enzymes, her alk phos is improved and decreased to 135. Her GGT is completely normal, indicating that the elevation in alk phos is not from her liver, and is from another source. She should follow-up with Dr. Harlow Mares to see if she needs a bone scan.

## 2018-07-07 ENCOUNTER — Telehealth: Payer: Self-pay | Admitting: Family Medicine

## 2018-07-07 DIAGNOSIS — R748 Abnormal levels of other serum enzymes: Secondary | ICD-10-CM

## 2018-07-07 DIAGNOSIS — N920 Excessive and frequent menstruation with regular cycle: Secondary | ICD-10-CM | POA: Diagnosis not present

## 2018-07-07 DIAGNOSIS — R9389 Abnormal findings on diagnostic imaging of other specified body structures: Secondary | ICD-10-CM | POA: Diagnosis not present

## 2018-07-07 DIAGNOSIS — E8881 Metabolic syndrome: Secondary | ICD-10-CM | POA: Diagnosis not present

## 2018-07-07 NOTE — Telephone Encounter (Signed)
Copied from [place] [id number]. Topic: Quick Communication - See Telephone Encounter >> Jul 07, 2018  9:07 AM Nils Flack wrote: CRM for notification. See Telephone encounter for: 07/07/18. Per scheduling, the img code needs to be changes to [phone]

## 2018-07-16 ENCOUNTER — Encounter
Admission: RE | Admit: 2018-07-16 | Discharge: 2018-07-16 | Disposition: A | Discharge status: Home / Self Care | Payer: 59 | Source: Ambulatory Visit | Attending: Family Medicine | Admitting: Family Medicine

## 2018-07-16 DIAGNOSIS — R748 Abnormal levels of other serum enzymes: Secondary | ICD-10-CM | POA: Diagnosis present

## 2018-07-16 MED ORDER — TECHNETIUM TC 99M MEDRONATE IV KIT
20.0000 | PACK | Freq: Once | INTRAVENOUS | Status: AC | PRN
  Administered 2018-07-16: 23.131 via INTRAVENOUS

## 2018-07-21 ENCOUNTER — Other Ambulatory Visit: Payer: Self-pay | Admitting: Family Medicine

## 2018-07-21 DIAGNOSIS — R948 Abnormal results of function studies of other organs and systems: Secondary | ICD-10-CM

## 2018-07-21 DIAGNOSIS — R936 Abnormal findings on diagnostic imaging of limbs: Secondary | ICD-10-CM

## 2018-07-24 ENCOUNTER — Ambulatory Visit
Admission: RE | Admit: 2018-07-24 | Discharge: 2018-07-24 | Disposition: A | Discharge status: Home / Self Care | Payer: BLUE CROSS/BLUE SHIELD | Source: Ambulatory Visit | Attending: Family Medicine | Admitting: Family Medicine

## 2018-07-24 ENCOUNTER — Other Ambulatory Visit: Payer: Self-pay | Admitting: Family Medicine

## 2018-07-24 DIAGNOSIS — R948 Abnormal results of function studies of other organs and systems: Secondary | ICD-10-CM | POA: Insufficient documentation

## 2018-07-24 DIAGNOSIS — M899 Disorder of bone, unspecified: Secondary | ICD-10-CM

## 2018-07-24 DIAGNOSIS — R936 Abnormal findings on diagnostic imaging of limbs: Secondary | ICD-10-CM

## 2018-08-12 ENCOUNTER — Ambulatory Visit: Payer: BLUE CROSS/BLUE SHIELD

## 2018-08-16 ENCOUNTER — Other Ambulatory Visit: Payer: Self-pay | Admitting: Family Medicine

## 2018-08-24 ENCOUNTER — Telehealth: Payer: Self-pay | Admitting: Family Medicine

## 2018-08-24 NOTE — Telephone Encounter (Signed)
I still don't have patient's MRI Please check into this and get this scheduled ASAP

## 2018-08-25 NOTE — Telephone Encounter (Signed)
Thank you :)

## 2018-08-25 NOTE — Telephone Encounter (Signed)
Checked into this, referral coordinator states she had an appt 1/22 and canceled.  I called patient she states she did not cancel it was something about her ins.  I told referral coordinator to reschedule and get prior auth asap. She is working on that now.

## 2018-09-04 ENCOUNTER — Ambulatory Visit
Admission: RE | Admit: 2018-09-04 | Discharge: 2018-09-04 | Disposition: A | Discharge status: Home / Self Care | Payer: BLUE CROSS/BLUE SHIELD | Source: Ambulatory Visit | Attending: Family Medicine | Admitting: Family Medicine

## 2018-09-04 ENCOUNTER — Other Ambulatory Visit: Payer: Self-pay | Admitting: Family Medicine

## 2018-09-04 DIAGNOSIS — R936 Abnormal findings on diagnostic imaging of limbs: Secondary | ICD-10-CM | POA: Insufficient documentation

## 2018-09-04 DIAGNOSIS — M899 Disorder of bone, unspecified: Secondary | ICD-10-CM | POA: Diagnosis present

## 2018-09-04 DIAGNOSIS — R948 Abnormal results of function studies of other organs and systems: Secondary | ICD-10-CM

## 2018-09-04 MED ORDER — GADOBUTROL 1 MMOL/ML IV SOLN
10.0000 mL | Freq: Once | INTRAVENOUS | Status: AC | PRN
  Administered 2018-09-04: 10 mL via INTRAVENOUS

## 2018-09-14 ENCOUNTER — Encounter: Payer: Self-pay | Admitting: Family Medicine

## 2018-09-16 MED ORDER — BUTALBITAL-ACETAMINOPHEN 25-325 MG PO TABS: 1.0000 | ORAL_TABLET | Freq: Every day | ORAL | 0 refills | Status: AC | PRN

## 2019-02-07 ENCOUNTER — Telehealth: Payer: Self-pay | Admitting: Family Medicine

## 2019-02-08 NOTE — Telephone Encounter (Signed)
appt scheduled and pt informed that script has been sent to pharmacy

## 2019-02-08 NOTE — Telephone Encounter (Signed)
Please schedule patient for follow up in the next 30 days.  

## 2019-03-12 ENCOUNTER — Other Ambulatory Visit: Payer: Self-pay

## 2019-03-12 ENCOUNTER — Ambulatory Visit (INDEPENDENT_AMBULATORY_CARE_PROVIDER_SITE_OTHER): Payer: 59 | Admitting: Nurse Practitioner

## 2019-03-12 ENCOUNTER — Encounter: Payer: Self-pay | Admitting: Nurse Practitioner

## 2019-03-12 VITALS — BP 118/84 | HR 93 | Temp 97.1°F | Resp 14 | Ht 68.0 in | Wt 256.8 lb

## 2019-03-12 DIAGNOSIS — E282 Polycystic ovarian syndrome: Secondary | ICD-10-CM | POA: Diagnosis not present

## 2019-03-12 DIAGNOSIS — G43109 Migraine with aura, not intractable, without status migrainosus: Secondary | ICD-10-CM | POA: Diagnosis not present

## 2019-03-12 DIAGNOSIS — F325 Major depressive disorder, single episode, in full remission: Secondary | ICD-10-CM | POA: Insufficient documentation

## 2019-03-12 DIAGNOSIS — D500 Iron deficiency anemia secondary to blood loss (chronic): Secondary | ICD-10-CM

## 2019-03-12 DIAGNOSIS — E669 Obesity, unspecified: Secondary | ICD-10-CM | POA: Diagnosis not present

## 2019-03-12 NOTE — Patient Instructions (Signed)
- docusate sodium 100mg  stool softner can take up to daily but just as needed to prevent straining with bowel movements.  High-Fiber Diet Fiber, also called dietary fiber, is a type of carbohydrate that is found in fruits, vegetables, whole grains, and beans. A high-fiber diet can have many health benefits. Your health care provider may recommend a high-fiber diet to help:  Prevent constipation. Fiber can make your bowel movements more regular.  Lower your cholesterol.  Relieve the following conditions: ? Swelling of veins in the anus (hemorrhoids). ? Swelling and irritation (inflammation) of specific areas of the digestive tract (uncomplicated diverticulosis). ? A problem of the large intestine (colon) that sometimes causes pain and diarrhea (irritable bowel syndrome, IBS).  Prevent overeating as part of a weight-loss plan.  Prevent heart disease, type 2 diabetes, and certain cancers. What is my plan? The recommended daily fiber intake in grams (g) includes:  38 g for men age 15 or younger.  30 g for men over age 18.  90 g for women age 12 or younger.  21 g for women over age 17. You can get the recommended daily intake of dietary fiber by:  Eating a variety of fruits, vegetables, grains, and beans.  Taking a fiber supplement, if it is not possible to get enough fiber through your diet. What do I need to know about a high-fiber diet?  It is better to get fiber through food sources rather than from fiber supplements. There is not a lot of research about how effective supplements are.  Always check the fiber content on the nutrition facts label of any prepackaged food. Look for foods that contain 5 g of fiber or more per serving.  Talk with a diet and nutrition specialist (dietitian) if you have questions about specific foods that are recommended or not recommended for your medical condition, especially if those foods are not listed below.  Gradually increase how much fiber you  consume. If you increase your intake of dietary fiber too quickly, you may have bloating, cramping, or gas.  Drink plenty of water. Water helps you to digest fiber. What are tips for following this plan?  Eat a wide variety of high-fiber foods.  Make sure that half of the grains that you eat each day are whole grains.  Eat breads and cereals that are made with whole-grain flour instead of refined flour or white flour.  Eat brown rice, bulgur wheat, or millet instead of white rice.  Start the day with a breakfast that is high in fiber, such as a cereal that contains 5 g of fiber or more per serving.  Use beans in place of meat in soups, salads, and pasta dishes.  Eat high-fiber snacks, such as berries, raw vegetables, nuts, and popcorn.  Choose whole fruits and vegetables instead of processed forms like juice or sauce. What foods can I eat?  Fruits Berries. Pears. Apples. Oranges. Avocado. Prunes and raisins. Dried figs. Vegetables Sweet potatoes. Spinach. Kale. Artichokes. Cabbage. Broccoli. Cauliflower. Green peas. Carrots. Squash. Grains Whole-grain breads. Multigrain cereal. Oats and oatmeal. Brown rice. Barley. Bulgur wheat. [place]. Quinoa. Bran muffins. Popcorn. Rye wafer crackers. Meats and other proteins Navy, kidney, and pinto beans. Soybeans. Split peas. Lentils. Nuts and seeds. Dairy Fiber-fortified yogurt. Beverages Fiber-fortified soy milk. Fiber-fortified orange juice. Other foods Fiber bars. The items listed above may not be a complete list of recommended foods and beverages. Contact a dietitian for more options. What foods are not recommended? Fruits Fruit juice. Cooked,  strained fruit. Vegetables Fried potatoes. Canned vegetables. Well-cooked vegetables. Grains White bread. Pasta made with refined flour. White rice. Meats and other proteins Fatty cuts of meat. Fried chicken or fried fish. Dairy Milk. Yogurt. Cream cheese. Sour cream. Fats and oils  Butters. Beverages Soft drinks. Other foods Cakes and pastries. The items listed above may not be a complete list of foods and beverages to avoid. Contact a dietitian for more information. Summary  Fiber is a type of carbohydrate. It is found in fruits, vegetables, whole grains, and beans.  There are many health benefits of eating a high-fiber diet, such as preventing constipation, lowering blood cholesterol, helping with weight loss, and reducing your risk of heart disease, diabetes, and certain cancers.  Gradually increase your intake of fiber. Increasing too fast can result in cramping, bloating, and gas. Drink plenty of water while you increase your fiber.  The best sources of fiber include whole fruits and vegetables, whole grains, nuts, seeds, and beans. This information is not intended to replace advice given to you by your health care provider. Make sure you discuss any questions you have with your health care provider. Document Released: 07/08/2005 Document Revised: 05/12/2017 Document Reviewed: 05/12/2017 Elsevier Patient Education  2020 [employer].

## 2019-03-12 NOTE — Progress Notes (Signed)
Name: Sabrina Mcgee   MRN: [id number]    DOB: 05-13-1991   Date:03/12/2019       Progress Note  Subjective  Chief Complaint  Chief Complaint  Patient presents with  . Follow-up    HPI  Migraines See neurologist- Dr. Shelva Majestic fiorcet just as needed Gets them 2-3 times a month, last 1-4 days;has been getting fewer episodes and also not lasting as long.   PCOS Is taking insitol TID, follows up with Dr. Leonides Schanz OBGYN   IDA anemia Takes iron supplementation daily. Does endorses some mild constipation. Has heavy irregular periods.  Wt Readings from Last 3 Encounters:  03/12/19 256 lb 12.8 oz (116.5 kg)  06/22/18 250 lb (113.4 kg)  06/15/18 250 lb (113.4 kg)    Depression Patient was taking lexapro 20mg  for about 3-4 years and came off of it a few weeks ago and is doing fine.    PHQ2/9: Depression screen [facility] 2/9 03/12/2019 05/08/2018 05/08/2018 02/05/2018 01/01/2017  Decreased Interest 0 0 0 0 1  Down, Depressed, Hopeless 0 1 1 1 1   PHQ - 2 Score 0 1 1 1 2   Altered sleeping 0 1 - 1 1  Tired, decreased energy 0 1 - 3 1  Change in appetite 0 1 - 2 1  Feeling bad or failure about yourself  0 1 - 3 1  Trouble concentrating 0 1 - 1 0  Moving slowly or fidgety/restless 0 0 - 0 1  Suicidal thoughts 0 0 - 0 0  PHQ-9 Score 0 6 - 11 7  Difficult doing work/chores Not difficult at all Somewhat difficult - Not difficult at all Not difficult at all     PHQ reviewed. Negative  Patient Active Problem List   Diagnosis Date Noted  . Insomnia 05/18/2018  . Migraine headache with aura 02/05/2018  . Chronic migraine 04/30/2017  . Weight gain 04/30/2017  . Nystagmus 03/12/2017  . Headache 03/12/2017  . Obesity (BMI 35.0-39.9 without comorbidity) 01/01/2017  . Headache, chronic daily 03/22/2016  . Microcytic anemia 02/28/2015  . Vitamin D deficiency 02/28/2015  . Social anxiety disorder 02/27/2015  . Depression 02/27/2015  . Allergic rhinitis 02/27/2015    Past Medical History:   Diagnosis Date  . Anemia   . Anxiety   . Depression   . Family history of adverse reaction to anesthesia    moher nausea and vomiting  . Goiter   . Migraine   . Pituitary adenoma ([place])   . Vitamin D deficiency disease     Past Surgical History:  Procedure Laterality Date  . HYSTEROSCOPY W/D&C N/A 06/22/2018   Procedure: DILATATION AND CURETTAGE /HYSTEROSCOPY;  Surgeon: Ward, Honor Loh, MD;  Location: [facility] ORS;  Service: Gynecology;  Laterality: N/A;  . ROBOTIC ASSISTED LAPAROSCOPIC CHOLECYSTECTOMY N/A 10/10/2015   Procedure: ROBOTIC ASSISTED LAPAROSCOPIC CHOLECYSTECTOMY;  Surgeon: Clayburn Pert, MD;  Location: [facility] ORS;  Service: General;  Laterality: N/A;  . TONSILLECTOMY  2004    Social History   Tobacco Use  . Smoking status: Never Smoker  . Smokeless tobacco: Never Used  Substance Use Topics  . Alcohol use: No     Current Outpatient Medications:  .  Butalbital-Acetaminophen 25-325 MG TABS, Take 1 tablet by mouth daily as needed (headache)., Disp: 12 tablet, Rfl: 0 .  Ferrous Sulfate (IRON) 325 (65 Fe) MG TABS, Take 325 mg by mouth daily. , Disp: , Rfl:  .  Multiple Vitamin (MULTIVITAMIN) tablet, Take 1 tablet by mouth every evening. ,  Disp: , Rfl:  .  escitalopram (LEXAPRO) 20 MG tablet, Take 1 tablet (20 mg total) by mouth at bedtime. (Patient not taking: Reported on 03/12/2019), Disp: 30 tablet, Rfl: 0 .  ondansetron (ZOFRAN ODT) 4 MG disintegrating tablet, Take 1 tablet (4 mg total) by mouth every 8 (eight) hours as needed. (Patient not taking: Reported on 03/12/2019), Disp: 20 tablet, Rfl: 11  Allergies  Allergen Reactions  . Shellfish Allergy Nausea Only and Rash    Migraines    ROS   No other specific complaints in a complete review of systems (except as listed in HPI above).  Objective  Vitals:   03/12/19 0917  BP: 118/84  Pulse: 93  Resp: 14  Temp: (!) 97.1 F (36.2 C)  TempSrc: Oral  SpO2: 98%  Weight: 256 lb 12.8 oz (116.5 kg)  Height: 5\' 8"   (1.727 m)     Body mass index is 39.05 kg/m.  Nursing Note and Vital Signs reviewed.  Physical Exam Vitals signs reviewed.  Constitutional:      Appearance: She is well-developed.  HENT:     Head: Normocephalic and atraumatic.  Neck:     Musculoskeletal: Normal range of motion and neck supple.     Vascular: No carotid bruit.  Cardiovascular:     Heart sounds: Normal heart sounds.  Pulmonary:     Effort: Pulmonary effort is normal.     Breath sounds: Normal breath sounds.  Abdominal:     General: Bowel sounds are normal.     Palpations: Abdomen is soft.     Tenderness: There is no abdominal tenderness.  Musculoskeletal: Normal range of motion.  Skin:    General: Skin is warm and dry.     Capillary Refill: Capillary refill takes less than 2 seconds.  Neurological:     Mental Status: She is alert and oriented to person, place, and time.     GCS: GCS eye subscore is 4. GCS verbal subscore is 5. GCS motor subscore is 6.     Sensory: No sensory deficit.  Psychiatric:        Speech: Speech normal.        Behavior: Behavior normal.        Thought Content: Thought content normal.        Judgment: Judgment normal.        No results found for this or any previous visit (from the past 48 hour(s)).  Assessment & Plan  1. Migraine with aura and without status migrainosus, not intractable Takes fioricet PRN   2. MDD (major depressive disorder), single episode, in full remission ([place]) Remission, not on meds and doing fine   3. Obesity (BMI 35.0-39.9 without comorbidity) Given nutrition information   4. PCOS (polycystic ovarian syndrome) Follows up with GYN   5. Iron deficiency anemia due to chronic blood loss On supplementation

## 2019-07-12 ENCOUNTER — Encounter: Payer: 59 | Admitting: Family Medicine

## 2019-07-28 ENCOUNTER — Ambulatory Visit (INDEPENDENT_AMBULATORY_CARE_PROVIDER_SITE_OTHER): Payer: 59 | Admitting: Family Medicine

## 2019-07-28 ENCOUNTER — Other Ambulatory Visit (HOSPITAL_COMMUNITY)
Admission: RE | Admit: 2019-07-28 | Discharge: 2019-07-28 | Disposition: A | Discharge status: Home / Self Care | Payer: BLUE CROSS/BLUE SHIELD | Source: Ambulatory Visit | Attending: Family Medicine | Admitting: Family Medicine

## 2019-07-28 ENCOUNTER — Other Ambulatory Visit: Payer: Self-pay

## 2019-07-28 ENCOUNTER — Encounter: Payer: Self-pay | Admitting: Family Medicine

## 2019-07-28 VITALS — BP 114/82 | HR 101 | Temp 97.3°F | Resp 12 | Ht 68.0 in | Wt 256.4 lb

## 2019-07-28 DIAGNOSIS — Z124 Encounter for screening for malignant neoplasm of cervix: Secondary | ICD-10-CM

## 2019-07-28 DIAGNOSIS — D500 Iron deficiency anemia secondary to blood loss (chronic): Secondary | ICD-10-CM

## 2019-07-28 DIAGNOSIS — Z1329 Encounter for screening for other suspected endocrine disorder: Secondary | ICD-10-CM

## 2019-07-28 DIAGNOSIS — E559 Vitamin D deficiency, unspecified: Secondary | ICD-10-CM | POA: Diagnosis not present

## 2019-07-28 DIAGNOSIS — Z23 Encounter for immunization: Secondary | ICD-10-CM

## 2019-07-28 DIAGNOSIS — B373 Candidiasis of vulva and vagina: Secondary | ICD-10-CM

## 2019-07-28 DIAGNOSIS — N921 Excessive and frequent menstruation with irregular cycle: Secondary | ICD-10-CM

## 2019-07-28 DIAGNOSIS — F325 Major depressive disorder, single episode, in full remission: Secondary | ICD-10-CM

## 2019-07-28 DIAGNOSIS — Z130 Encounter for screening for diseases of the blood and blood-forming organs and certain disorders involving the immune mechanism: Secondary | ICD-10-CM

## 2019-07-28 DIAGNOSIS — Z01419 Encounter for gynecological examination (general) (routine) without abnormal findings: Secondary | ICD-10-CM

## 2019-07-28 DIAGNOSIS — Z6838 Body mass index (BMI) 38.0-38.9, adult: Secondary | ICD-10-CM

## 2019-07-28 DIAGNOSIS — D509 Iron deficiency anemia, unspecified: Secondary | ICD-10-CM

## 2019-07-28 DIAGNOSIS — F419 Anxiety disorder, unspecified: Secondary | ICD-10-CM

## 2019-07-28 DIAGNOSIS — E282 Polycystic ovarian syndrome: Secondary | ICD-10-CM

## 2019-07-28 DIAGNOSIS — B3731 Acute candidiasis of vulva and vagina: Secondary | ICD-10-CM

## 2019-07-28 DIAGNOSIS — Z13220 Encounter for screening for lipoid disorders: Secondary | ICD-10-CM

## 2019-07-28 DIAGNOSIS — G4700 Insomnia, unspecified: Secondary | ICD-10-CM

## 2019-07-28 DIAGNOSIS — Z13228 Encounter for screening for other metabolic disorders: Secondary | ICD-10-CM

## 2019-07-28 MED ORDER — BUSPIRONE HCL 5 MG PO TABS: 5.0000 mg | ORAL_TABLET | Freq: Three times a day (TID) | ORAL | 1 refills | Status: AC | PRN

## 2019-07-28 MED ORDER — HYDROXYZINE HCL 25 MG PO TABS: 12.5000 mg | ORAL_TABLET | Freq: Every day | ORAL | 1 refills | Status: AC

## 2019-07-28 MED ORDER — FLUCONAZOLE 150 MG PO TABS: 150.0000 mg | ORAL_TABLET | ORAL | 0 refills | Status: AC | PRN

## 2019-07-28 NOTE — Patient Instructions (Addendum)
Health Maintenance, Female Adopting a healthy lifestyle and getting preventive care are important in promoting health and wellness. Ask your health care provider about:  The right schedule for you to have regular tests and exams.  Things you can do on your own to prevent diseases and keep yourself healthy. What should I know about diet, weight, and exercise? Eat a healthy diet   Eat a diet that includes plenty of vegetables, fruits, low-fat dairy products, and lean protein.  Do not eat a lot of foods that are high in solid fats, added sugars, or sodium. Maintain a healthy weight Body mass index (BMI) is used to identify weight problems. It estimates body fat based on height and weight. Your health care provider can help determine your BMI and help you achieve or maintain a healthy weight. Get regular exercise Get regular exercise. This is one of the most important things you can do for your health. Most adults should:  Exercise for at least 150 minutes each week. The exercise should increase your heart rate and make you sweat (moderate-intensity exercise).  Do strengthening exercises at least twice a week. This is in addition to the moderate-intensity exercise.  Spend less time sitting. Even light physical activity can be beneficial. Watch cholesterol and blood lipids Have your blood tested for lipids and cholesterol at 27 years of age, then have this test every 5 years. Have your cholesterol levels checked more often if:  Your lipid or cholesterol levels are high.  You are older than 27 years of age.  You are at high risk for heart disease. What should I know about cancer screening? Depending on your health history and family history, you may need to have cancer screening at various ages. This may include screening for:  Breast cancer.  Cervical cancer.  Colorectal cancer.  Skin cancer.  Lung cancer. What should I know about heart disease, diabetes, and high blood  pressure? Blood pressure and heart disease  High blood pressure causes heart disease and increases the risk of stroke. This is more likely to develop in people who have high blood pressure readings, are of African descent, or are overweight.  Have your blood pressure checked: ? Every 3-5 years if you are 54-9 years of age. ? Every year if you are 69 years old or older. Diabetes Have regular diabetes screenings. This checks your fasting blood sugar level. Have the screening done:  Once every three years after age 36 if you are at a normal weight and have a low risk for diabetes.  More often and at a younger age if you are overweight or have a high risk for diabetes. What should I know about preventing infection? Hepatitis B If you have a higher risk for hepatitis B, you should be screened for this virus. Talk with your health care provider to find out if you are at risk for hepatitis B infection. Hepatitis C Testing is recommended for:  Everyone born from [birth year] through 1963.  Anyone with known risk factors for hepatitis C. Sexually transmitted infections (STIs)  Get screened for STIs, including gonorrhea and chlamydia, if: ? You are sexually active and are younger than 27 years of age. ? You are older than 27 years of age and your health care provider tells you that you are at risk for this type of infection. ? Your sexual activity has changed since you were last screened, and you are at increased risk for chlamydia or gonorrhea. Ask your health care provider  if you are at risk.  Ask your health care provider about whether you are at high risk for HIV. Your health care provider may recommend a prescription medicine to help prevent HIV infection. If you choose to take medicine to prevent HIV, you should first get tested for HIV. You should then be tested every 3 months for as long as you are taking the medicine. Pregnancy  If you are about to stop having your period (premenopausal) and  you may become pregnant, seek counseling before you get pregnant.  Take 400 to 800 micrograms (mcg) of folic acid every day if you become pregnant.  Ask for birth control (contraception) if you want to prevent pregnancy. Osteoporosis and menopause Osteoporosis is a disease in which the bones lose minerals and strength with aging. This can result in bone fractures. If you are 69 years old or older, or if you are at risk for osteoporosis and fractures, ask your health care provider if you should:  Be screened for bone loss.  Take a calcium or vitamin D supplement to lower your risk of fractures.  Be given hormone replacement therapy (HRT) to treat symptoms of menopause. Follow these instructions at home: Lifestyle  Do not use any products that contain nicotine or tobacco, such as cigarettes, e-cigarettes, and chewing tobacco. If you need help quitting, ask your health care provider.  Do not use street drugs.  Do not share needles.  Ask your health care provider for help if you need support or information about quitting drugs. Alcohol use  Do not drink alcohol if: ? Your health care provider tells you not to drink. ? You are pregnant, may be pregnant, or are planning to become pregnant.  If you drink alcohol: ? Limit how much you use to 0-1 drink a day. ? Limit intake if you are breastfeeding.  Be aware of how much alcohol is in your drink. In the [place] one drink equals one 12 oz bottle of beer (355 mL), one 5 oz glass of wine (148 mL), or one 1 oz glass of hard liquor (44 mL). General instructions  Schedule regular health, dental, and eye exams.  Stay current with your vaccines.  Tell your health care provider if: ? You often feel depressed. ? You have ever been abused or do not feel safe at home. Summary  Adopting a healthy lifestyle and getting preventive care are important in promoting health and wellness.  Follow your health care provider's instructions about healthy  diet, exercising, and getting tested or screened for diseases.  Follow your health care provider's instructions on monitoring your cholesterol and blood pressure. This information is not intended to replace advice given to you by your health care provider. Make sure you discuss any questions you have with your health care provider. Document Revised: 07/01/2018 Document Reviewed: 07/01/2018 Elsevier Patient Education  2020 [facility]) Exercise Recommendation  Being physically active is important to prevent heart disease and stroke, the nation's No. 1and No. 5killers. To improve overall cardiovascular health, we suggest at least 150 minutes per week of moderate exercise or 75 minutes per week of vigorous exercise (or a combination of moderate and vigorous activity). Thirty minutes a day, five times a week is an easy goal to remember. You will also experience benefits even if you divide your time into two or three segments of 10 to 15 minutes per day.  For people who would benefit from lowering their blood pressure or cholesterol, we  recommend 40 minutes of aerobic exercise of moderate to vigorous intensity three to four times a week to lower the risk for heart attack and stroke.  Physical activity is anything that makes you move your body and burn calories.  This includes things like climbing stairs or playing sports. Aerobic exercises benefit your heart, and include walking, jogging, swimming or biking. Strength and stretching exercises are best for overall stamina and flexibility.  The simplest, positive change you can make to effectively improve your heart health is to start walking. It's enjoyable, free, easy, social and great exercise. A walking program is flexible and boasts high success rates because people can stick with it. It's easy for walking to become a regular and satisfying part of life.   For Overall Cardiovascular Health:  At least 30 minutes  of moderate-intensity aerobic activity at least 5 days per week for a total of 150  OR   At least 25 minutes of vigorous aerobic activity at least 3 days per week for a total of 75 minutes; or a combination of moderate- and vigorous-intensity aerobic activity  AND   Moderate- to high-intensity muscle-strengthening activity at least 2 days per week for additional health benefits.  For Lowering Blood Pressure and Cholesterol  An average 40 minutes of moderate- to vigorous-intensity aerobic activity 3 or 4 times per week  What if I can't make it to the time goal? Something is always better than nothing! And everyone has to start somewhere. Even if you've been sedentary for years, today is the day you can begin to make healthy changes in your life. If you don't think you'll make it for 30 or 40 minutes, set a reachable goal for today. You can work up toward your overall goal by increasing your time as you get stronger. Don't let all-or-nothing thinking rob you of doing what you can every day.  Source:http://www.heart.org    Vaginal Yeast Infection, Adult  Vaginal yeast infection is a condition that causes vaginal discharge as well as soreness, swelling, and redness (inflammation) of the vagina. This is a common condition. Some women get this infection frequently. What are the causes? This condition is caused by a change in the normal balance of the yeast (candida) and bacteria that live in the vagina. This change causes an overgrowth of yeast, which causes the inflammation. What increases the risk? The condition is more likely to develop in women who:  Take antibiotic medicines.  Have diabetes.  Take birth control pills.  Are pregnant.  Douche often.  Have a weak body defense system (immune system).  Have been taking steroid medicines for a long time.  Frequently wear tight clothing. What are the signs or symptoms? Symptoms of this condition include:  White, thick, creamy  vaginal discharge.  Swelling, itching, redness, and irritation of the vagina. The lips of the vagina (vulva) may be affected as well.  Pain or a burning feeling while urinating.  Pain during sex. How is this diagnosed? This condition is diagnosed based on:  Your medical history.  A physical exam.  A pelvic exam. Your health care provider will examine a sample of your vaginal discharge under a microscope. Your health care provider may send this sample for testing to confirm the diagnosis. How is this treated? This condition is treated with medicine. Medicines may be over-the-counter or prescription. You may be told to use one or more of the following:  Medicine that is taken by mouth (orally).  Medicine that is applied as  a cream (topically).  Medicine that is inserted directly into the vagina (suppository). Follow these instructions at home:  Lifestyle  Do not have sex until your health care provider approves. Tell your sex partner that you have a yeast infection. That person should go to his or her health care provider and ask if they should also be treated.  Do not wear tight clothes, such as pantyhose or tight pants.  Wear breathable cotton underwear. General instructions  Take or apply over-the-counter and prescription medicines only as told by your health care provider.  Eat more yogurt. This may help to keep your yeast infection from returning.  Do not use tampons until your health care provider approves.  Try taking a sitz bath to help with discomfort. This is a warm water bath that is taken while you are sitting down. The water should only come up to your hips and should cover your buttocks. Do this 3-4 times per day or as told by your health care provider.  Do not douche.  If you have diabetes, keep your blood sugar levels under control.  Keep all follow-up visits as told by your health care provider. This is important. Contact a health care provider if:  You  have a fever.  Your symptoms go away and then return.  Your symptoms do not get better with treatment.  Your symptoms get worse.  You have new symptoms.  You develop blisters in or around your vagina.  You have blood coming from your vagina and it is not your menstrual period.  You develop pain in your abdomen. Summary  Vaginal yeast infection is a condition that causes discharge as well as soreness, swelling, and redness (inflammation) of the vagina.  This condition is treated with medicine. Medicines may be over-the-counter or prescription.  Take or apply over-the-counter and prescription medicines only as told by your health care provider.  Do not douche. Do not have sex or use tampons until your health care provider approves.  Contact a health care provider if your symptoms do not get better with treatment or your symptoms go away and then return. This information is not intended to replace advice given to you by your health care provider. Make sure you discuss any questions you have with your health care provider. Document Revised: 02/05/2019 Document Reviewed: 11/24/2017 Elsevier Patient Education  [place].

## 2019-07-28 NOTE — Progress Notes (Signed)
Patient: Sabrina Mcgee, Female    DOB: 01-11-1991, 29 y.o.   MRN: [id number] Delsa Grana, PA-C Visit Date: 07/28/2019  Today's Provider: Delsa Grana, PA-C   Chief Complaint  Patient presents with  . Annual Exam   Subjective:   Annual physical exam:  Sabrina Mcgee is a 29 y.o. female who presents today for complete physical exam:  Exercise/Activity:  Not right now, prior to shut down was doing [place] and yoga  Diet/nutrition:  No specific diet, "could be better"  Sleep: poorly, 4 hours sleep on average.    Hx of anemia secondary to chronic blood loss with heavy menses Patient's last menstrual period was 07/06/2019. Cannot remember period prior to December, typically bleeds 5-6 days, was 8 days before her D&C done in Jan History of PCOS, insulin resistance, regular periods with heavy bleeding and anemia.  Patient denies depressive symptoms but she does endorse being very anxious with some recent worsening.  She is tachycardic today and appears very anxious.  She is willing to do the GAD-7 questions with me and discuss.   GAD 7 : Generalized Anxiety Score 07/28/2019 05/15/2015  Nervous, Anxious, on Edge 2 3  Control/stop worrying 2 0  Worry too much - different things 2 1  Trouble relaxing 3 3  Restless 2 0  Easily annoyed or irritable 1 0  Afraid - awful might happen 0 0  Total GAD 7 Score 12 7  Anxiety Difficulty Not difficult at all Not difficult at all  Per chart she has a history of insomnia and social anxiety  USPSTF grade A and B recommendations - reviewed and addressed today  Depression:  Phq 9 completed today by patient, was reviewed by me with patient in the room, score is  positive, pt feels good PHQ 2/9 Scores 07/28/2019 03/12/2019 05/08/2018 05/08/2018  PHQ - 2 Score 0 0 1 1  PHQ- 9 Score 0 0 6 -   Depression screen [facility] 2/9 07/28/2019 03/12/2019 05/08/2018 05/08/2018 02/05/2018  Decreased Interest 0 0 0 0 0  Down, Depressed, Hopeless 1 0 1 1 1   PHQ - 2  Score 1 0 1 1 1   Altered sleeping 3 0 1 - 1  Tired, decreased energy 3 0 1 - 3  Change in appetite 0 0 1 - 2  Feeling bad or failure about yourself  1 0 1 - 3  Trouble concentrating 1 0 1 - 1  Moving slowly or fidgety/restless 0 0 0 - 0  Suicidal thoughts 0 0 0 - 0  PHQ-9 Score 9 0 6 - 11  Difficult doing work/chores Not difficult at all Not difficult at all Somewhat difficult - Not difficult at all  Some recent data might be hidden   Anxiety Sx currently: GAD 7 : Generalized Anxiety Score 07/28/2019 05/15/2015  Nervous, Anxious, on Edge 2 3  Control/stop worrying 2 0  Worry too much - different things 2 1  Trouble relaxing 3 3  Restless 2 0  Easily annoyed or irritable 1 0  Afraid - awful might happen 0 0  Total GAD 7 Score 12 7  Anxiety Difficulty Not difficult at all Not difficult at all     Alcohol screening:   [facility] Visit from 07/28/2019 in [facility]  AUDIT-C Score  0      Immunizations and Health Maintenance: Health Maintenance  Topic Date Due  . PAP-Cervical Cytology Screening  01/13/2014  . PAP SMEAR-Modifier  08/22/2018  .  TETANUS/TDAP  08/31/2025  . INFLUENZA VACCINE  Completed  . HIV Screening  Completed     Hep C Screening: n/a  STD testing and prevention (HIV/chl/gon/syphilis): none needed  Intimate partner violence: denies  Sexual History/Pain during Intercourse: Single  Menstrual History/LMP/Abnormal Bleeding:  Irregular PCOS Patient's last menstrual period was 07/06/2019.  Incontinence Symptoms: none  Breast cancer:  Last Mammogram: n/a BRCA gene screening:  Dads sister has breast CA   Cervical cancer screening:  Family hx of cancers - breast, ovarian, uterine, colon no immediate family members, great grandmother CRC  Osteoporosis:   Discussed high calcium and vitamin D supplementation, weight bearing exercises   Skin cancer:  Hx of skin CA -  NO Discussed atypical lesions   Colorectal cancer:   colonoscopy is  n/a  Lung cancer:   Low Dose CT Chest recommended if Age 41-80 years, 30 pack-year currently smoking OR have quit w/in 15years. Patient does not qualify.   Social History   Tobacco Use  . Smoking status: Never Smoker  . Smokeless tobacco: Never Used  Substance Use Topics  . Alcohol use: No     ECG:  n/a  Blood pressure/Hypertension: BP Readings from Last 3 Encounters:  07/28/19 114/82  03/12/19 118/84  06/22/18 120/68    Weight/Obesity: Wt Readings from Last 3 Encounters:  07/28/19 256 lb 6.4 oz (116.3 kg)  03/12/19 256 lb 12.8 oz (116.5 kg)  06/22/18 250 lb (113.4 kg)   BMI Readings from Last 3 Encounters:  07/28/19 38.99 kg/m  03/12/19 39.05 kg/m  06/22/18 38.01 kg/m     Lipids:  Lab Results  Component Value Date   CHOL 172 02/06/2018   CHOL 154 02/27/2015   Lab Results  Component Value Date   HDL 48 (L) 02/06/2018   HDL 55 02/27/2015   Lab Results  Component Value Date   LDLCALC 105 (H) 02/06/2018   LDLCALC 86 02/27/2015   Lab Results  Component Value Date   TRIG 92 02/06/2018   TRIG 67 02/27/2015   Lab Results  Component Value Date   CHOLHDL 3.6 02/06/2018   No results found for: LDLDIRECT Based on the results of lipid panel his/her cardiovascular risk factor ( using [place] )  in the next 10 years is: The ASCVD Risk score Mikey Bussing DC Jr., et al., 2013) failed to calculate for the following reasons:   The 2013 ASCVD risk score is only valid for ages 79 to 33  Glucose:  Glucose  Date Value Ref Range Status  04/16/2018 84 65 - 99 mg/dL Final   Glucose, Bld  Date Value Ref Range Status  06/22/2018 97 70 - 99 mg/dL Final  02/06/2018 88 65 - 99 mg/dL Final    Comment:    .            Fasting reference interval .   01/01/2017 90 65 - 99 mg/dL Final      [facility] Visit [facility] 07/28/2019 in [facility]  AUDIT-C Score  0     Depression: Phq 9 is  negative Depression screen [facility] 2/9 07/28/2019 03/12/2019 05/08/2018  05/08/2018 02/05/2018  Decreased Interest 0 0 0 0 0  Down, Depressed, Hopeless 0 0 1 1 1   PHQ - 2 Score 0 0 1 1 1   Altered sleeping 0 0 1 - 1  Tired, decreased energy 0 0 1 - 3  Change in appetite 0 0 1 - 2  Feeling bad or failure about yourself  0 0  1 - 3  Trouble concentrating 0 0 1 - 1  Moving slowly or fidgety/restless 0 0 0 - 0  Suicidal thoughts 0 0 0 - 0  PHQ-9 Score 0 0 6 - 11  Difficult doing work/chores Not difficult at all Not difficult at all Somewhat difficult - Not difficult at all  Some recent data might be hidden   Hypertension: BP Readings from Last 3 Encounters:  07/28/19 114/82  03/12/19 118/84  06/22/18 120/68   Obesity: Wt Readings from Last 3 Encounters:  07/28/19 256 lb 6.4 oz (116.3 kg)  03/12/19 256 lb 12.8 oz (116.5 kg)  06/22/18 250 lb (113.4 kg)   BMI Readings from Last 3 Encounters:  07/28/19 38.99 kg/m  03/12/19 39.05 kg/m  06/22/18 38.01 kg/m     Social History      She  reports that she has never smoked. She has never used smokeless tobacco. She reports that she does not drink alcohol or use drugs.       Social History   Socioeconomic History  . Marital status: Single    Spouse name: Not on file  . Number of children: 0  . Years of education: Bachelors  . Highest education level: Not on file  Occupational History  . Occupation: Retail  Tobacco Use  . Smoking status: Never Smoker  . Smokeless tobacco: Never Used  Substance and Sexual Activity  . Alcohol use: No  . Drug use: No  . Sexual activity: Not Currently    Birth control/protection: None  Other Topics Concern  . Not on file  Social History Narrative   Lives at home with parents.   Left-handed.   Occasional caffeine use.   Social Determinants of Health   Financial Resource Strain:   . Difficulty of Paying Living Expenses: Not on file  Food Insecurity:   . Worried About Charity fundraiser in the Last Year: Not on file  . Ran Out of Food in the Last Year: Not on  file  Transportation Needs:   . Lack of Transportation (Medical): Not on file  . Lack of Transportation (Non-Medical): Not on file  Physical Activity:   . Days of Exercise per Week: Not on file  . Minutes of Exercise per Session: Not on file  Stress:   . Feeling of Stress : Not on file  Social Connections:   . Frequency of Communication with Friends and Family: Not on file  . Frequency of Social Gatherings with Friends and Family: Not on file  . Attends Religious Services: Not on file  . Active Member of Clubs or Organizations: Not on file  . Attends Archivist Meetings: Not on file  . Marital Status: Not on file    Family History        Family Status  Relation Name Status  . Mother  Alive  . Father  Alive  . Ethlyn Daniels  (Not Specified)  . MGM  (Not Specified)  . MGF  (Not Specified)  . PGM  (Not Specified)  . PGF  (Not Specified)  . Ethlyn Daniels  (Not Specified)  . Brother  Alive  . Brother  Alive        Her family history includes COPD in her paternal grandmother; Cancer in her father, paternal aunt, and paternal grandfather; Diabetes in her paternal aunt; Heart disease in her father, maternal grandfather, maternal grandmother, mother, paternal grandfather, and paternal grandmother; Hypertension in her father, maternal grandfather, maternal grandmother, mother, paternal grandfather,  and paternal grandmother.       Family History  Problem Relation Age of Onset  . Heart disease Mother   . Hypertension Mother   . Cancer Father        kidney  . Heart disease Father   . Hypertension Father   . Cancer Paternal Aunt        breast  . Heart disease Maternal Grandmother   . Hypertension Maternal Grandmother   . Heart disease Maternal Grandfather   . Hypertension Maternal Grandfather   . Heart disease Paternal Grandmother   . Hypertension Paternal Grandmother   . COPD Paternal Grandmother   . Cancer Paternal Grandfather        prostate  . Heart disease Paternal  Grandfather   . Hypertension Paternal Grandfather   . Diabetes Paternal Aunt     Patient Active Problem List   Diagnosis Date Noted  . PCOS (polycystic ovarian syndrome) 03/12/2019  . MDD (major depressive disorder), single episode, in full remission ([place]) 03/12/2019  . Insomnia 05/18/2018  . Migraine headache with aura 02/05/2018  . Nystagmus 03/12/2017  . Obesity (BMI 35.0-39.9 without comorbidity) 01/01/2017  . Microcytic anemia 02/28/2015  . Vitamin D deficiency 02/28/2015  . Social anxiety disorder 02/27/2015  . Allergic rhinitis 02/27/2015    Past Surgical History:  Procedure Laterality Date  . HYSTEROSCOPY WITH D & C N/A 06/22/2018   Procedure: DILATATION AND CURETTAGE /HYSTEROSCOPY;  Surgeon: Ward, Honor Loh, MD;  Location: [facility] ORS;  Service: Gynecology;  Laterality: N/A;  . ROBOTIC ASSISTED LAPAROSCOPIC CHOLECYSTECTOMY N/A 10/10/2015   Procedure: ROBOTIC ASSISTED LAPAROSCOPIC CHOLECYSTECTOMY;  Surgeon: Clayburn Pert, MD;  Location: [facility] ORS;  Service: General;  Laterality: N/A;  . TONSILLECTOMY  2004     Current Outpatient Medications:  .  Butalbital-Acetaminophen 25-325 MG TABS, Take 1 tablet by mouth daily as needed (headache)., Disp: 12 tablet, Rfl: 0 .  Ferrous Sulfate (IRON) 325 (65 Fe) MG TABS, Take 325 mg by mouth daily. , Disp: , Rfl:  .  Multiple Vitamin (MULTIVITAMIN) tablet, Take 1 tablet by mouth every evening. , Disp: , Rfl:   Allergies  Allergen Reactions  . Shellfish Allergy Nausea Only and Rash    Migraines    Patient Care Team: Delsa Grana, PA-C as PCP - General (Family Medicine) Virgel Manifold, MD as Consulting Physician (Gastroenterology)  Review of Systems  Constitutional: Negative.  Negative for activity change, appetite change, fatigue and unexpected weight change.  HENT: Negative.   Eyes: Negative.   Respiratory: Negative.  Negative for shortness of breath.   Cardiovascular: Negative.  Negative for chest pain, palpitations and  leg swelling.  Gastrointestinal: Negative.  Negative for abdominal pain and blood in stool.  Endocrine: Negative.   Genitourinary: Positive for menstrual problem.  Musculoskeletal: Negative.  Negative for arthralgias, gait problem, joint swelling and myalgias.  Skin: Negative.  Negative for color change, pallor and rash.  Allergic/Immunologic: Negative.   Neurological: Negative.  Negative for syncope and weakness.  Hematological: Negative.   Psychiatric/Behavioral: Positive for dysphoric mood and sleep disturbance. Negative for confusion, self-injury and suicidal ideas. The patient is nervous/anxious.   All other systems reviewed and are negative.          Objective:   Vitals:  Vitals:   07/28/19 1304  BP: 114/82  Pulse: (!) 119  Resp: 12  Temp: (!) 97.3 F (36.3 C)  TempSrc: Temporal  SpO2: 98%  Weight: 256 lb 6.4 oz (116.3 kg)  Height: 5' 8"  (1.727  m)    Body mass index is 38.99 kg/m.  Physical Exam Constitutional:      General: She is not in acute distress.    Appearance: Normal appearance. She is well-developed. She is obese. She is not toxic-appearing or diaphoretic.  HENT:     Head: Normocephalic and atraumatic.     Right Ear: External ear normal.     Left Ear: External ear normal.     Nose: Nose normal.     Mouth/Throat:     Pharynx: Uvula midline.  Eyes:     General: Lids are normal.     Conjunctiva/sclera: Conjunctivae normal.     Pupils: Pupils are equal, round, and reactive to light.  Neck:     Trachea: Phonation normal. No tracheal deviation.  Cardiovascular:     Rate and Rhythm: Normal rate and regular rhythm.     Pulses: Normal pulses.          Radial pulses are 2+ on the right side and 2+ on the left side.       Posterior tibial pulses are 2+ on the right side and 2+ on the left side.     Heart sounds: Normal heart sounds. No murmur. No friction rub. No gallop.   Pulmonary:     Effort: Pulmonary effort is normal. No respiratory distress.      Breath sounds: Normal breath sounds. No stridor. No wheezing, rhonchi or rales.  Chest:     Chest wall: No tenderness.  Abdominal:     General: Bowel sounds are normal. There is no distension.     Palpations: Abdomen is soft.     Tenderness: There is no abdominal tenderness. There is no guarding or rebound.     Hernia: There is no hernia in the left inguinal area or right inguinal area.  Genitourinary:    Labia:        Right: No rash, tenderness, lesion or injury.        Left: No rash, tenderness, lesion or injury.      Urethra: No prolapse.     Vagina: No signs of injury. Vaginal discharge and erythema present. No tenderness or lesions.     Comments: Difficult to visualize cervix due to narrow vaginal walls, brush sample obtained of deepest tissue seen, but did not directly see cervical os Vaginal and cervical (?) tissue erythematous and friable With copious vaginal discharge with white chunks Normal bimanual exam, no adnexal ttp or fullness Musculoskeletal:        General: No deformity. Normal range of motion.     Cervical back: Normal range of motion and neck supple.  Lymphadenopathy:     Cervical: No cervical adenopathy.  Skin:    General: Skin is warm and dry.     Capillary Refill: Capillary refill takes less than 2 seconds.     Coloration: Skin is not pale.     Findings: No rash.  Neurological:     Mental Status: She is alert and oriented to person, place, and time.     Motor: No abnormal muscle tone.     Gait: Gait normal.  Psychiatric:        Speech: Speech normal.        Behavior: Behavior normal.       Fall Risk: Fall Risk  07/28/2019 03/12/2019 05/08/2018 02/05/2018 01/01/2017  Falls in the past year? 0 0 No No No  Number falls in past yr: 0 0 - - -  Injury with Fall?  1 0 - - -    Functional Status Survey: Is the patient deaf or have difficulty hearing?: No Does the patient have difficulty seeing, even when wearing glasses/contacts?: No Does the patient have  difficulty concentrating, remembering, or making decisions?: Yes(sometimes) Does the patient have difficulty walking or climbing stairs?: No Does the patient have difficulty dressing or bathing?: No Does the patient have difficulty doing errands alone such as visiting a doctor's office or shopping?: No   Assessment & Plan:    CPE completed today  . USPSTF grade A and B recommendations reviewed with patient; age-appropriate recommendations, preventive care, screening tests, etc discussed and encouraged; healthy living encouraged; see AVS for patient education given to patient  . Discussed importance of 150 minutes of physical activity weekly, AHA exercise recommendations given to pt in AVS/handout  . Discussed importance of healthy diet:  eating lean meats and proteins, avoiding trans fats and saturated fats, avoid simple sugars and excessive carbs in diet, eat 6 servings of fruit/vegetables daily and drink plenty of water and avoid sweet beverages.    . Recommended pt to do annual eye exam and routine dental exams/cleanings  . Depression, alcohol, fall screening completed as documented above and per flowsheets  . Reviewed Health Maintenance: Health Maintenance  Topic Date Due  . PAP-Cervical Cytology Screening  01/13/2014  . PAP SMEAR-Modifier  08/22/2018  . TETANUS/TDAP  08/31/2025  . INFLUENZA VACCINE  Completed  . HIV Screening  Completed    . Immunizations: Immunization History  Administered Date(s) Administered  . Influenza,inj,Quad PF,6+ Mos 05/15/2015, 09/05/2016, 05/08/2018, 07/28/2019  . Tdap 09/01/2015     Orders Placed This Encounter  Procedures  . Flu Vaccine QUAD 6+ mos PF IM (Fluarix Quad PF)         ICD-10-CM   1. Well woman exam with routine gynecological exam  Z01.419 CBC +diff and smear    CMP w GFR    Lipid Panel    A1C    TSH  2. Cervical cancer screening  Z12.4 PAP with HPV   Very difficult exam may need to see OB/GYN, suspect no endocervical  cells obtained, difficult to visualize  3. Iron deficiency anemia due to chronic blood loss  D50.0 Iron, TIBC and Ferritin Panel    A1C  4. Vitamin D deficiency  E55.9 Vit D   recheck  5. MDD (major depressive disorder), single episode, in full remission ([place])  F32.5 busPIRone (BUSPAR) 5 MG tablet    hydrOXYzine (ATARAX/VISTARIL) 25 MG tablet   still some sx, no meds, is doing therapy monthly and also as needed  6. Class 2 severe obesity with serious comorbidity and body mass index (BMI) of 38.0 to 38.9 in adult, unspecified obesity type ([place])  E66.01    Z68.38   7. Screening for endocrine, metabolic and immunity disorder  Z13.29    Z13.228    Z13.0   8. Screening for lipoid disorders  Z13.220   9. Yeast vaginitis  B37.3    copious discharge and vaginal mucosa irritated - asx, but I suggested to pt she treat, may be secondary to insulin resistance  10. Microcytic anemia  D50.9    recheck labs  11. PCOS (polycystic ovarian syndrome)  E28.2    pt reports irregular menses, insulin resistance, has excessive hair - likely high testosterone  12. Menorrhagia with irregular cycle  N92.1    no change to hx or sx  13. Insomnia, unspecified type  G47.00    trial of  meds - vistaril for insomnia  14. Anxiety disorder, unspecified type  F41.9    fairly severe sx with insomnia, she has therapist, willing to try meds - buspar and vistaril, f/up in a few months     Delsa Grana, PA-C 07/28/19 1:32 PM  [facility]

## 2019-07-30 LAB — LIPID PANEL
Cholesterol: 148 mg/dL (ref <200)
HDL: 36 mg/dL — ABNORMAL LOW (ref >=50)
LDL Cholesterol (Calc): 95 mg/dL (calc)
Non-HDL Cholesterol (Calc): 112 mg/dL (calc) (ref <130)
Total CHOL/HDL Ratio: 4.1 (calc) (ref <5.0)
Triglycerides: 82 mg/dL (ref <150)

## 2019-07-30 LAB — TSH: TSH: 0.74 mIU/L

## 2019-07-30 LAB — COMPLETE METABOLIC PANEL WITH GFR
AG Ratio: 1.5 (calc) (ref 1.0–2.5)
ALT: 20 U/L (ref 6–29)
AST: 19 U/L (ref 10–30)
Albumin: 4.3 g/dL (ref 3.6–5.1)
Alkaline phosphatase (APISO): 145 U/L — ABNORMAL HIGH (ref 31–125)
BUN: 7 mg/dL (ref 7–25)
CO2: 23 mmol/L (ref 20–32)
Calcium: 8.9 mg/dL (ref 8.6–10.2)
Chloride: 108 mmol/L (ref 98–110)
Creat: 0.63 mg/dL (ref 0.50–1.10)
GFR, Est African American: 143 mL/min/{1.73_m2} (ref >=60)
GFR, Est Non African American: 124 mL/min/{1.73_m2} (ref >=60)
Globulin: 2.8 g/dL (calc) (ref 1.9–3.7)
Glucose, Bld: 88 mg/dL (ref 65–99)
Potassium: 3.9 mmol/L (ref 3.5–5.3)
Sodium: 139 mmol/L (ref 135–146)
Total Bilirubin: 0.3 mg/dL (ref 0.2–1.2)
Total Protein: 7.1 g/dL (ref 6.1–8.1)

## 2019-07-30 LAB — CBC (INCLUDES DIFF/PLT) WITH PATHOLOGIST REVIEW
Absolute Monocytes: 326 cells/uL (ref 200–950)
Basophils Absolute: 9 cells/uL (ref 0–200)
Basophils Relative: 0.3 %
Eosinophils Absolute: 0 cells/uL — ABNORMAL LOW (ref 15–500)
Eosinophils Relative: 0 %
HCT: 33.7 % — ABNORMAL LOW (ref 35.0–45.0)
Hemoglobin: 10.9 g/dL — ABNORMAL LOW (ref 11.7–15.5)
Lymphs Abs: 1460 cells/uL (ref 850–3900)
MCH: 23.5 pg — ABNORMAL LOW (ref 27.0–33.0)
MCHC: 32.3 g/dL (ref 32.0–36.0)
MCV: 72.6 fL — ABNORMAL LOW (ref 80.0–100.0)
Monocytes Relative: 10.5 %
Neutro Abs: 1305 cells/uL — ABNORMAL LOW (ref 1500–7800)
Neutrophils Relative %: 42.1 %
Platelets: 215 10*3/uL (ref 140–400)
RBC: 4.64 10*6/uL (ref 3.80–5.10)
RDW: 17.8 % — ABNORMAL HIGH (ref 11.0–15.0)
Total Lymphocyte: 47.1 %
WBC: 3.1 10*3/uL — ABNORMAL LOW (ref 3.8–10.8)

## 2019-07-30 LAB — VITAMIN D 25 HYDROXY (VIT D DEFICIENCY, FRACTURES): Vit D, 25-Hydroxy: 21 ng/mL — ABNORMAL LOW (ref 30–100)

## 2019-07-30 LAB — IRON,TIBC AND FERRITIN PANEL
%SAT: 10 % (calc) — ABNORMAL LOW (ref 16–45)
Ferritin: 12 ng/mL — ABNORMAL LOW (ref 16–154)
Iron: 40 ug/dL (ref 40–190)
TIBC: 404 mcg/dL (calc) (ref 250–450)

## 2019-07-30 LAB — CYTOLOGY - PAP
Comment: NEGATIVE
Diagnosis: NEGATIVE
High risk HPV: NEGATIVE

## 2019-07-30 LAB — HEMOGLOBIN A1C
Hgb A1c MFr Bld: 4.6 % of total Hgb (ref <5.7)
Mean Plasma Glucose: 85 (calc)
eAG (mmol/L): 4.7 (calc)

## 2019-08-02 ENCOUNTER — Other Ambulatory Visit: Payer: Self-pay | Admitting: Family Medicine

## 2019-08-02 DIAGNOSIS — R748 Abnormal levels of other serum enzymes: Secondary | ICD-10-CM

## 2019-08-02 DIAGNOSIS — D500 Iron deficiency anemia secondary to blood loss (chronic): Secondary | ICD-10-CM

## 2019-08-02 DIAGNOSIS — E559 Vitamin D deficiency, unspecified: Secondary | ICD-10-CM

## 2019-08-02 MED ORDER — VITAMIN D (ERGOCALCIFEROL) 1.25 MG (50000 UNIT) PO CAPS: 50000.0000 [IU] | ORAL_CAPSULE | ORAL | 0 refills | Status: AC

## 2019-08-02 MED ORDER — IRON 325 (65 FE) MG PO TABS: 325.0000 mg | ORAL_TABLET | ORAL | 1 refills | Status: AC

## 2019-09-29 ENCOUNTER — Ambulatory Visit: Payer: Self-pay | Admitting: Family Medicine
